# Patient Record
Sex: Male | Born: 1965 | Race: White | Hispanic: No | Marital: Married | Smoking: Current every day smoker
Health system: Southern US, Community
[De-identification: ages and names within clinical notes are randomized; demographics above are authoritative.]

## PROBLEM LIST (undated history)

## (undated) ENCOUNTER — Emergency Department (HOSPITAL_COMMUNITY): Payer: Self-pay

## (undated) DIAGNOSIS — S069XAA Unspecified intracranial injury with loss of consciousness status unknown, initial encounter: Secondary | ICD-10-CM

## (undated) DIAGNOSIS — F419 Anxiety disorder, unspecified: Secondary | ICD-10-CM

## (undated) DIAGNOSIS — I2510 Atherosclerotic heart disease of native coronary artery without angina pectoris: Secondary | ICD-10-CM

## (undated) DIAGNOSIS — T63441A Toxic effect of venom of bees, accidental (unintentional), initial encounter: Secondary | ICD-10-CM

## (undated) DIAGNOSIS — S069X9A Unspecified intracranial injury with loss of consciousness of unspecified duration, initial encounter: Secondary | ICD-10-CM

## (undated) DIAGNOSIS — A770 Spotted fever due to Rickettsia rickettsii: Secondary | ICD-10-CM

## (undated) DIAGNOSIS — I776 Arteritis, unspecified: Secondary | ICD-10-CM

## (undated) HISTORY — PX: OTHER SURGICAL HISTORY: SHX169

## (undated) HISTORY — DX: Atherosclerotic heart disease of native coronary artery without angina pectoris: I25.10

---

## 2008-08-25 ENCOUNTER — Telehealth: Payer: Self-pay | Admitting: Internal Medicine

## 2008-08-25 ENCOUNTER — Ambulatory Visit: Payer: Self-pay | Admitting: Internal Medicine

## 2008-08-25 ENCOUNTER — Ambulatory Visit (HOSPITAL_BASED_OUTPATIENT_CLINIC_OR_DEPARTMENT_OTHER): Admission: RE | Admit: 2008-08-25 | Discharge: 2008-08-25 | Payer: Self-pay | Admitting: Internal Medicine

## 2008-08-25 ENCOUNTER — Ambulatory Visit: Payer: Self-pay | Admitting: Interventional Radiology

## 2008-08-25 DIAGNOSIS — R0989 Other specified symptoms and signs involving the circulatory and respiratory systems: Secondary | ICD-10-CM

## 2008-08-25 DIAGNOSIS — R0609 Other forms of dyspnea: Secondary | ICD-10-CM | POA: Insufficient documentation

## 2008-08-25 DIAGNOSIS — R002 Palpitations: Secondary | ICD-10-CM

## 2008-08-25 DIAGNOSIS — F411 Generalized anxiety disorder: Secondary | ICD-10-CM

## 2008-08-25 DIAGNOSIS — Z87442 Personal history of urinary calculi: Secondary | ICD-10-CM

## 2008-08-25 DIAGNOSIS — F17200 Nicotine dependence, unspecified, uncomplicated: Secondary | ICD-10-CM

## 2008-08-25 LAB — CONVERTED CEMR LAB
Albumin: 4.3 g/dL (ref 3.5–5.2)
Alkaline Phosphatase: 64 units/L (ref 39–117)
BUN: 14 mg/dL (ref 6–23)
Basophils Relative: 0 % (ref 0.0–3.0)
Creatinine, Ser: 0.8 mg/dL (ref 0.4–1.5)
Direct LDL: 146.4 mg/dL
Eosinophils Absolute: 0.1 10*3/uL (ref 0.0–0.7)
Eosinophils Relative: 2 % (ref 0.0–5.0)
GFR calc Af Amer: 135 mL/min
GFR calc non Af Amer: 112 mL/min
Glucose, Bld: 95 mg/dL (ref 70–99)
HCT: 49.4 % (ref 39.0–52.0)
Hemoglobin: 17.2 g/dL — ABNORMAL HIGH (ref 13.0–17.0)
MCV: 90.4 fL (ref 78.0–100.0)
Monocytes Absolute: 0.4 10*3/uL (ref 0.1–1.0)
Monocytes Relative: 8.1 % (ref 3.0–12.0)
Neutro Abs: 3 10*3/uL (ref 1.4–7.7)
Pro B Natriuretic peptide (BNP): 4 pg/mL (ref 0.0–100.0)
RBC: 5.47 M/uL (ref 4.22–5.81)
TSH: 0.86 microintl units/mL (ref 0.35–5.50)
Total CHOL/HDL Ratio: 6.9
Total Protein: 6.7 g/dL (ref 6.0–8.3)
Triglycerides: 126 mg/dL (ref 0–149)
WBC: 4.7 10*3/uL (ref 4.5–10.5)

## 2008-08-29 ENCOUNTER — Encounter: Payer: Self-pay | Admitting: Cardiology

## 2008-08-29 ENCOUNTER — Ambulatory Visit: Payer: Self-pay | Admitting: Cardiology

## 2008-09-06 ENCOUNTER — Encounter: Payer: Self-pay | Admitting: Cardiology

## 2008-09-06 ENCOUNTER — Ambulatory Visit: Payer: Self-pay | Admitting: Cardiology

## 2008-09-06 ENCOUNTER — Ambulatory Visit: Payer: Self-pay

## 2008-09-12 ENCOUNTER — Ambulatory Visit: Payer: Self-pay | Admitting: Internal Medicine

## 2008-09-12 DIAGNOSIS — M25519 Pain in unspecified shoulder: Secondary | ICD-10-CM

## 2008-09-12 DIAGNOSIS — E785 Hyperlipidemia, unspecified: Secondary | ICD-10-CM | POA: Insufficient documentation

## 2008-09-12 DIAGNOSIS — M722 Plantar fascial fibromatosis: Secondary | ICD-10-CM

## 2008-09-14 ENCOUNTER — Encounter: Payer: Self-pay | Admitting: Internal Medicine

## 2008-11-23 ENCOUNTER — Encounter (INDEPENDENT_AMBULATORY_CARE_PROVIDER_SITE_OTHER): Payer: Self-pay | Admitting: *Deleted

## 2013-01-12 ENCOUNTER — Observation Stay (HOSPITAL_COMMUNITY): Payer: Worker's Compensation

## 2013-01-12 ENCOUNTER — Observation Stay (HOSPITAL_COMMUNITY)
Admission: EM | Admit: 2013-01-12 | Discharge: 2013-01-13 | Disposition: A | Discharge status: Home / Self Care | Payer: Worker's Compensation | Attending: Internal Medicine | Admitting: Internal Medicine

## 2013-01-12 ENCOUNTER — Emergency Department (HOSPITAL_COMMUNITY): Payer: Worker's Compensation

## 2013-01-12 ENCOUNTER — Encounter (HOSPITAL_COMMUNITY): Payer: Self-pay | Admitting: *Deleted

## 2013-01-12 DIAGNOSIS — T148XXA Other injury of unspecified body region, initial encounter: Secondary | ICD-10-CM

## 2013-01-12 DIAGNOSIS — W19XXXA Unspecified fall, initial encounter: Secondary | ICD-10-CM | POA: Insufficient documentation

## 2013-01-12 DIAGNOSIS — I959 Hypotension, unspecified: Secondary | ICD-10-CM | POA: Insufficient documentation

## 2013-01-12 DIAGNOSIS — IMO0002 Reserved for concepts with insufficient information to code with codable children: Secondary | ICD-10-CM | POA: Insufficient documentation

## 2013-01-12 DIAGNOSIS — R739 Hyperglycemia, unspecified: Secondary | ICD-10-CM

## 2013-01-12 DIAGNOSIS — R4020 Unspecified coma: Secondary | ICD-10-CM

## 2013-01-12 DIAGNOSIS — R7309 Other abnormal glucose: Secondary | ICD-10-CM | POA: Insufficient documentation

## 2013-01-12 DIAGNOSIS — T782XXA Anaphylactic shock, unspecified, initial encounter: Secondary | ICD-10-CM

## 2013-01-12 DIAGNOSIS — T63481A Toxic effect of venom of other arthropod, accidental (unintentional), initial encounter: Secondary | ICD-10-CM

## 2013-01-12 DIAGNOSIS — T6391XA Toxic effect of contact with unspecified venomous animal, accidental (unintentional), initial encounter: Principal | ICD-10-CM | POA: Insufficient documentation

## 2013-01-12 DIAGNOSIS — S0081XA Abrasion of other part of head, initial encounter: Secondary | ICD-10-CM

## 2013-01-12 DIAGNOSIS — T63461A Toxic effect of venom of wasps, accidental (unintentional), initial encounter: Secondary | ICD-10-CM | POA: Insufficient documentation

## 2013-01-12 DIAGNOSIS — R404 Transient alteration of awareness: Secondary | ICD-10-CM

## 2013-01-12 HISTORY — DX: Toxic effect of venom of bees, accidental (unintentional), initial encounter: T63.441A

## 2013-01-12 LAB — CG4 I-STAT (LACTIC ACID): Lactic Acid, Venous: 7.23 mmol/L — ABNORMAL HIGH (ref 0.5–2.2)

## 2013-01-12 LAB — COMPREHENSIVE METABOLIC PANEL
Alkaline Phosphatase: 63 U/L (ref 39–117)
BUN: 13 mg/dL (ref 6–23)
Creatinine, Ser: 1.24 mg/dL (ref 0.50–1.35)
GFR calc Af Amer: 78 mL/min — ABNORMAL LOW (ref >90)
Glucose, Bld: 172 mg/dL — ABNORMAL HIGH (ref 70–99)
Potassium: 3.3 mEq/L — ABNORMAL LOW (ref 3.5–5.1)
Total Protein: 5.5 g/dL — ABNORMAL LOW (ref 6.0–8.3)

## 2013-01-12 LAB — CBC WITH DIFFERENTIAL/PLATELET
Eosinophils Absolute: 0 10*3/uL (ref 0.0–0.7)
Eosinophils Relative: 1 % (ref 0–5)
HCT: 51.4 % (ref 39.0–52.0)
Hemoglobin: 18.2 g/dL — ABNORMAL HIGH (ref 13.0–17.0)
Lymphs Abs: 4.6 10*3/uL — ABNORMAL HIGH (ref 0.7–4.0)
MCH: 30.8 pg (ref 26.0–34.0)
MCV: 87 fL (ref 78.0–100.0)
Monocytes Absolute: 0 10*3/uL — ABNORMAL LOW (ref 0.1–1.0)
Monocytes Relative: 0 % — ABNORMAL LOW (ref 3–12)
RBC: 5.91 MIL/uL — ABNORMAL HIGH (ref 4.22–5.81)

## 2013-01-12 LAB — POCT I-STAT, CHEM 8
Calcium, Ion: 1.07 mmol/L — ABNORMAL LOW (ref 1.12–1.23)
Chloride: 112 mEq/L (ref 96–112)
Creatinine, Ser: 1.3 mg/dL (ref 0.50–1.35)
Glucose, Bld: 199 mg/dL — ABNORMAL HIGH (ref 70–99)
Potassium: 3 mEq/L — ABNORMAL LOW (ref 3.5–5.1)

## 2013-01-12 IMAGING — CT CT HEAD W/O CM
3 of 5 series · 16 of 47 positions shown, 19 images · non-contrast
Comparison: None.

CT HEAD

CLINICAL DATA: Anaphylactic reaction to a bee sting.  Acute mental
status changes.

CT HEAD WITHOUT CONTRAST
CT MAXILLOFACIAL WITHOUT CONTRAST
TECHNIQUE: Multidetector CT imaging of the head and maxillofacial
structures were performed using the standard protocol without
intravenous contrast. Multiplanar CT image reconstructions of the
maxillofacial structures were also generated.

[Series 4: facial bones · axial · 0.41mm/px · z∈[+28,+182]mm · 10 of 91 slices shown, 13 images]
[im 7/91  brain]
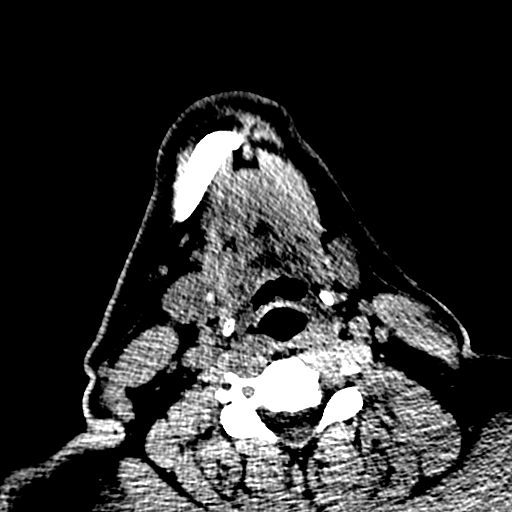
[im 7/91  bone]
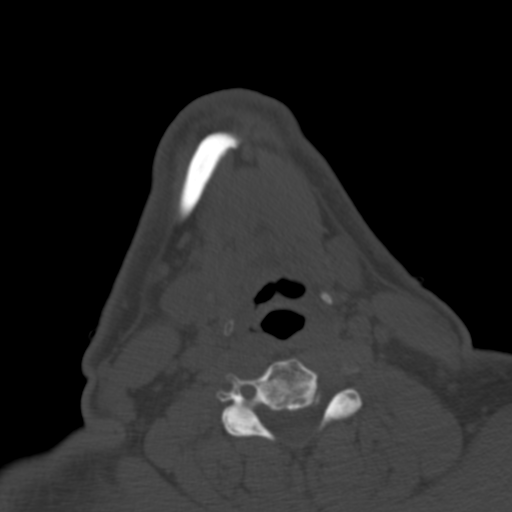
[im 14/91  brain]
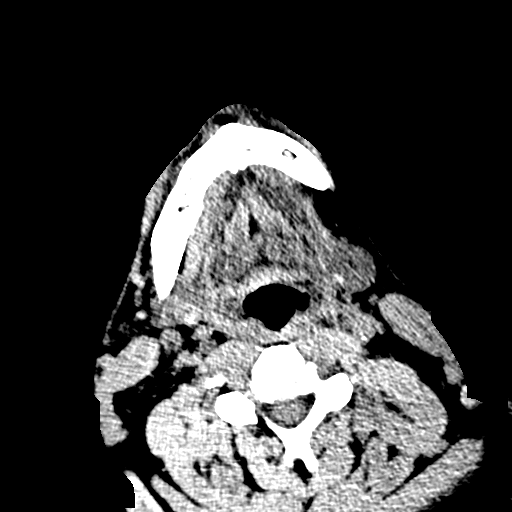
[im 28/91  brain]
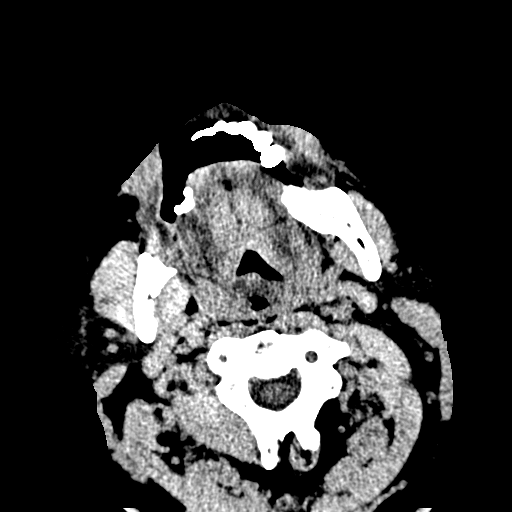
[im 35/91  brain]
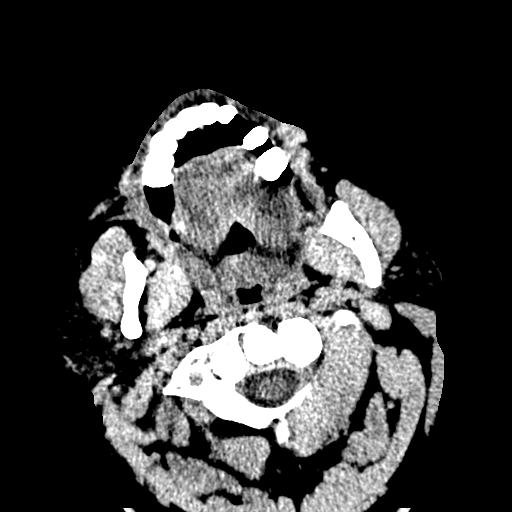
[im 42/91  brain]
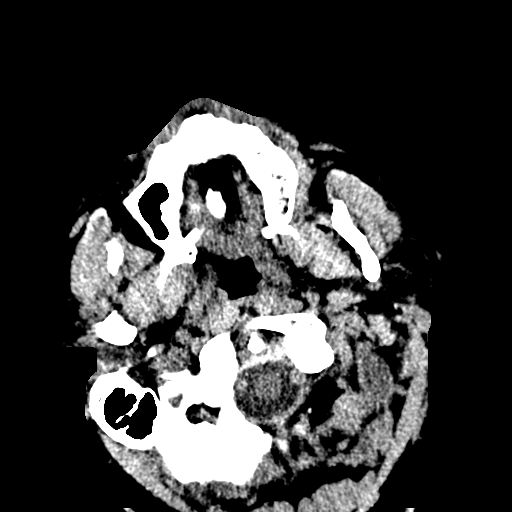
[im 42/91  bone]
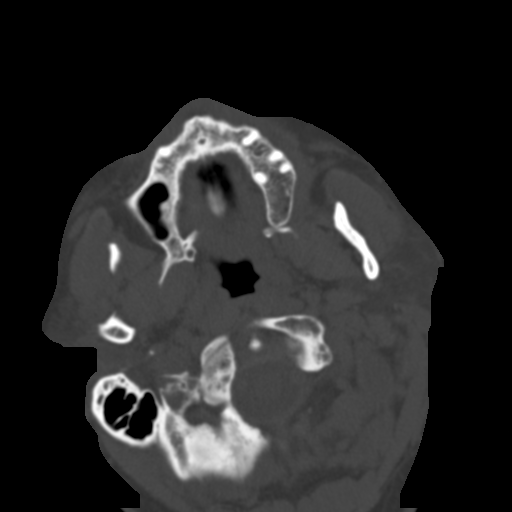
[im 49/91  brain]
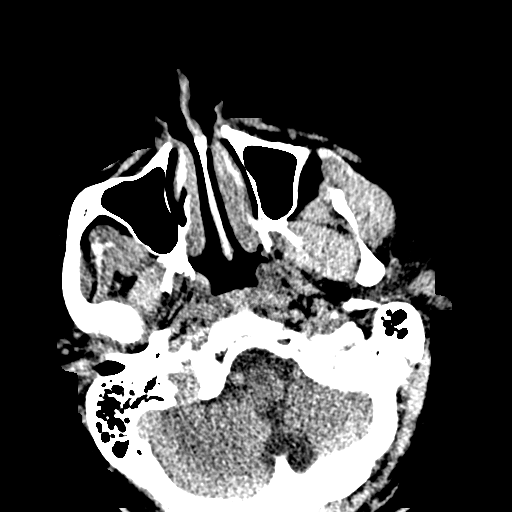
[im 56/91  brain]
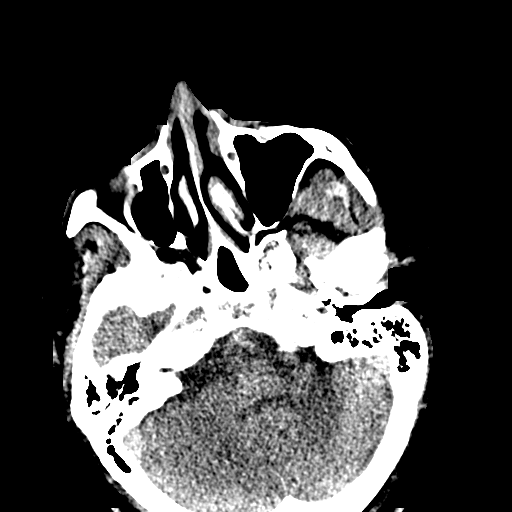
[im 70/91  brain]
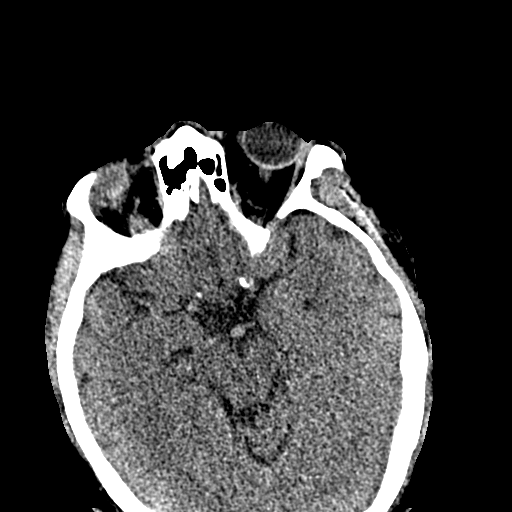
[im 77/91  brain]
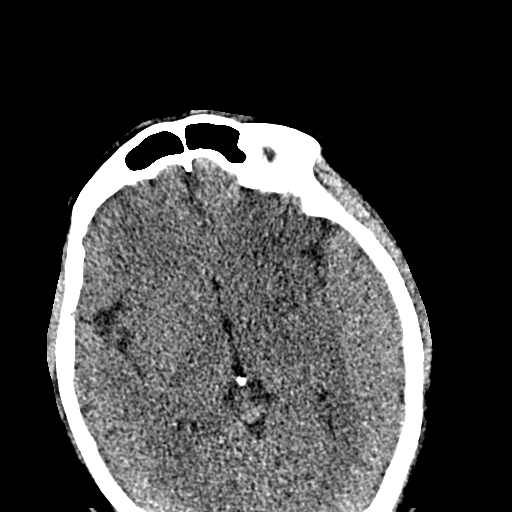
[im 77/91  bone]
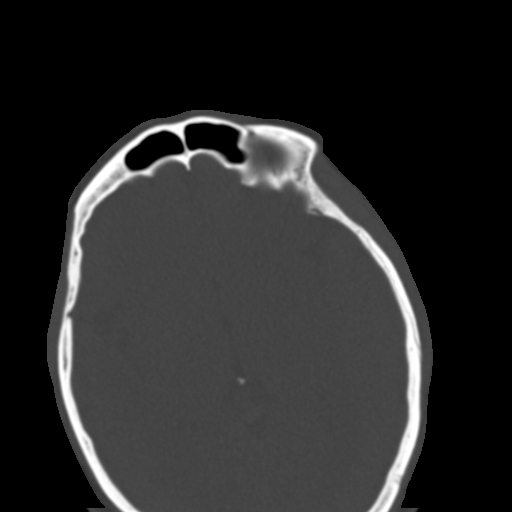
[im 84/91  brain]
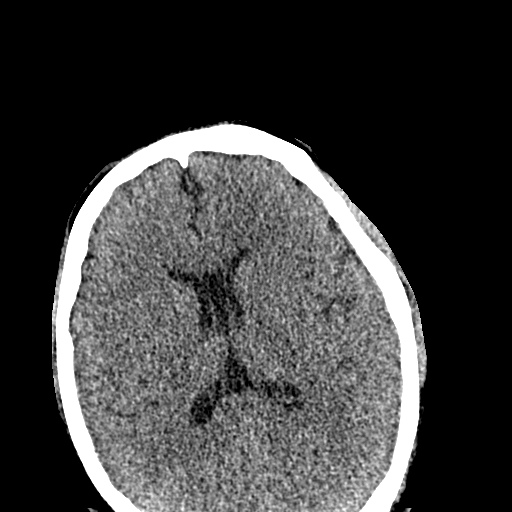

[st cor · coronal · 0.41mm/px · 3 of 88 slices shown]
[im 30/88  brain]
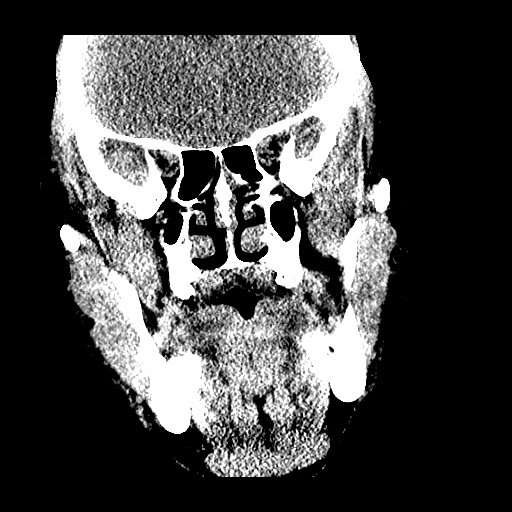
[im 39/88  brain]
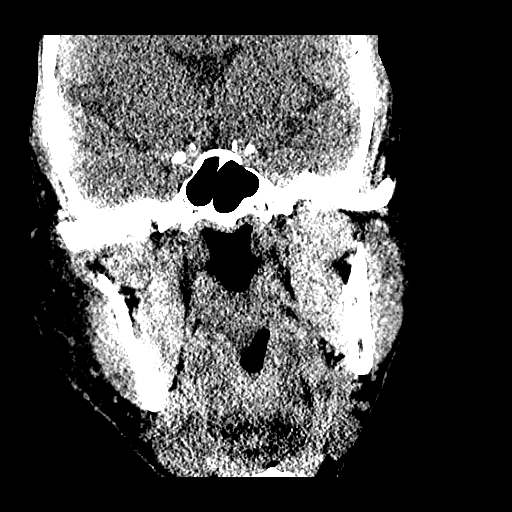
[im 49/88  brain]
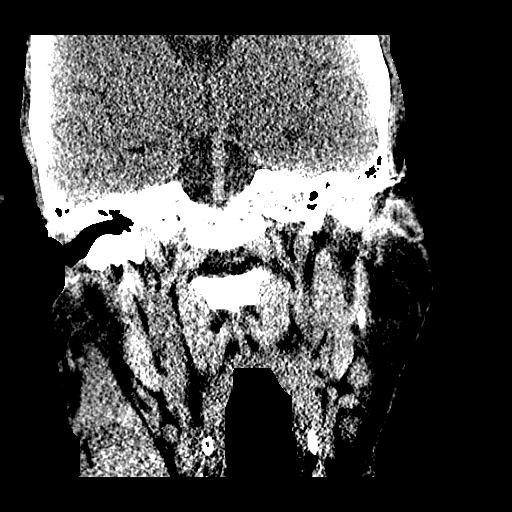

[st sag · sagittal · 0.41mm/px · 3 of 90 slices shown]
[im 30/90  brain]
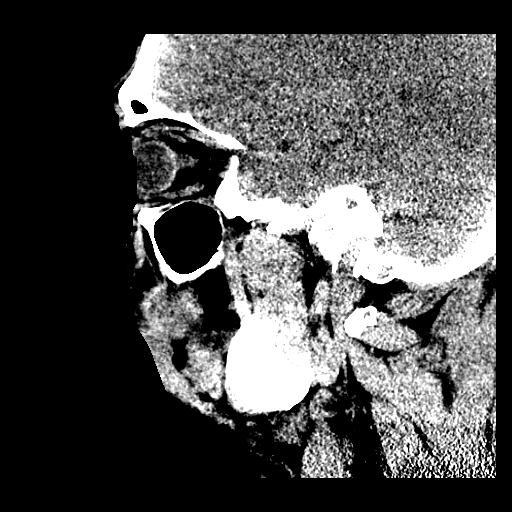
[im 45/90  brain]
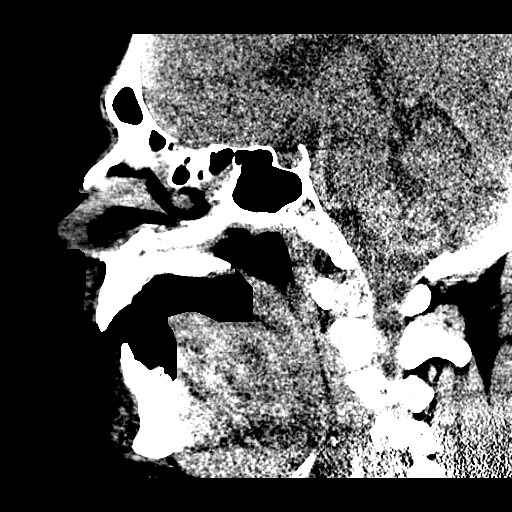
[im 60/90  brain]
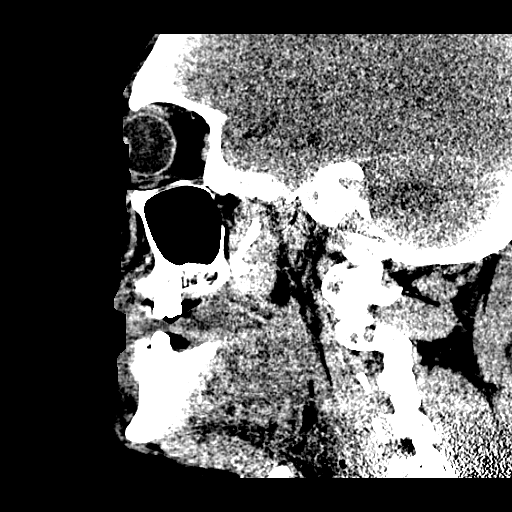

[16 of 47 positions shown; findings below may reference images not displayed]

FINDINGS: Ventricular system normal in size and appearance for age.
Cavum septum placenta and cavum vergae, a normal anatomic variant.
No mass lesion.  No midline shift.  No acute hemorrhage or
hematoma.  No extra-axial fluid collections.  No evidence of acute
infarction.  No focal brain parenchymal abnormalities.

No focal osseous abnormalities involving the skull.  Bilateral
mastoid air cells and middle ear cavities well-aerated.  Mild
bilateral carotid siphon atherosclerosis.
IMPRESSION: 1.  No acute or significant intracranial abnormality.
2.  Mild bilateral carotid siphon atherosclerosis, somewhat
advanced for age.

CT MAXILLOFACIAL
FINDINGS: No fractures identified involving the facial bones.
Temporomandibular joints intact with slight anterior subluxation of
the right TMJ.  Slight bony nasal septal deviation to the right.
Paranasal sinuses well-aerated.  Dystrophic calcification noted in
the lacrimal gland of the left orbit.  Periapical lucency involving
the third right mandibular molar, with only a portion of the tooth
remaining.  Probable tooth buds in both sides of the posterior
maxilla at the expected location of the third molars.
IMPRESSION: 1.  No fractures involving the facial bones.
2.  Slight anterior subluxation of the right temporomandibular
joint.
3.  Periapical lucency involving the third molar of the right
mandible; abscess is suspected, given the destruction of the tooth.

## 2013-01-12 MED ORDER — ONDANSETRON HCL 4 MG/2ML IJ SOLN: 4.0000 mg | Freq: Three times a day (TID) | INTRAMUSCULAR | Status: DC | PRN

## 2013-01-12 MED ORDER — FLUMAZENIL 0.5 MG/5ML IV SOLN
INTRAVENOUS | Status: AC
  Filled 2013-01-12: qty 5

## 2013-01-12 MED ORDER — ACETAMINOPHEN 650 MG RE SUPP: 650.0000 mg | Freq: Four times a day (QID) | RECTAL | Status: DC | PRN

## 2013-01-12 MED ORDER — ONDANSETRON HCL 4 MG PO TABS: 4.0000 mg | ORAL_TABLET | Freq: Four times a day (QID) | ORAL | Status: DC | PRN

## 2013-01-12 MED ORDER — DIPHENHYDRAMINE HCL 50 MG/ML IJ SOLN: 25.0000 mg | Freq: Three times a day (TID) | INTRAMUSCULAR | Status: DC | PRN

## 2013-01-12 MED ORDER — NICOTINE 14 MG/24HR TD PT24
14.0000 mg | MEDICATED_PATCH | Freq: Every day | TRANSDERMAL | Status: DC
  Administered 2013-01-12 – 2013-01-13 (×2): 14 mg via TRANSDERMAL
  Filled 2013-01-12 (×2): qty 1

## 2013-01-12 MED ORDER — ACETAMINOPHEN 325 MG PO TABS: 650.0000 mg | ORAL_TABLET | Freq: Four times a day (QID) | ORAL | Status: DC | PRN

## 2013-01-12 MED ORDER — POTASSIUM CHLORIDE 10 MEQ/100ML IV SOLN
10.0000 meq | Freq: Once | INTRAVENOUS | Status: AC
  Administered 2013-01-12: 10 meq via INTRAVENOUS
  Filled 2013-01-12: qty 100

## 2013-01-12 MED ORDER — SODIUM CHLORIDE 0.9 % IV SOLN
INTRAVENOUS | Status: DC
  Administered 2013-01-12 – 2013-01-13 (×2): via INTRAVENOUS

## 2013-01-12 MED ORDER — SODIUM CHLORIDE 0.9 % IJ SOLN
3.0000 mL | Freq: Two times a day (BID) | INTRAMUSCULAR | Status: DC
  Administered 2013-01-13: 3 mL via INTRAVENOUS

## 2013-01-12 MED ORDER — OXYCODONE HCL 5 MG PO TABS
5.0000 mg | ORAL_TABLET | ORAL | Status: DC | PRN
  Administered 2013-01-12: 5 mg via ORAL
  Filled 2013-01-12: qty 1

## 2013-01-12 MED ORDER — POTASSIUM CHLORIDE CRYS ER 20 MEQ PO TBCR
40.0000 meq | EXTENDED_RELEASE_TABLET | Freq: Once | ORAL | Status: AC
  Administered 2013-01-12: 40 meq via ORAL
  Filled 2013-01-12: qty 2

## 2013-01-12 MED ORDER — ONDANSETRON HCL 4 MG/2ML IJ SOLN: 4.0000 mg | Freq: Four times a day (QID) | INTRAMUSCULAR | Status: DC | PRN

## 2013-01-12 NOTE — ED Notes (Signed)
Results of lactic acid shown to Dr. Malen Gauze

## 2013-01-12 NOTE — ED Notes (Signed)
Pt undressed, in gown, on monitor, continuous pulse oximetry, blood pressure cuff and oxygen [place] (2L); sheet given

## 2013-01-12 NOTE — ED Provider Notes (Signed)
CSN: [id number]     Arrival date & time 01/12/13  1242 History     First MD Initiated Contact with Patient 01/12/13 1245     Chief Complaint  Patient presents with  . Allergic Reaction   (Consider location/radiation/quality/duration/timing/severity/associated sxs/prior Treatment) HPI Darin White 47 y.o. presents by EMS for concern of an allergic reaction reportedly do to a bee sting. He was found in a parking lot on the ground. This occurred approximately 30 minutes prior to arrival. He reportedly gave himself one dose of epinephrine 3 EpiPen that he had on self. Patient was found confused, in respiratory distress, and combative. EMS gave the patient another dose of intramuscular epinephrine, 125 mg Solu-Medrol, Benadryl, and Zantac. Patient continued to be combative en route with EMS and was given 5 mg of Versed for his agitation. On arrival patient is breathing spontaneously and in no respiratory distress but not following commands or answering questions.  History reviewed. No pertinent past medical history. History reviewed. No pertinent past surgical history. History reviewed. No pertinent family history. History  Substance Use Topics  . Smoking status: Not on file  . Smokeless tobacco: Not on file  . Alcohol Use: Not on file    Review of Systems  Unable to perform ROS   Allergies  Review of patient's allergies indicates not on file.  Home Medications  No current outpatient prescriptions on file. BP 90/64  Pulse 81  Resp 25  SpO2 99% Physical Exam  Nursing note and vitals reviewed. Constitutional: He appears well-developed and well-nourished. No distress.  HENT:  Head: Normocephalic. Head is with abrasion (Right forehead and right cheek).  Eyes: Conjunctivae are normal. Pupils are equal, round, and reactive to light. Right eye exhibits no discharge. Left eye exhibits no discharge. Right pupil is reactive. Left pupil is reactive.  Neck: Normal range of motion. Neck  supple. No tracheal deviation present.  Cardiovascular: Regular rhythm and normal heart sounds.  Tachycardia present.  Exam reveals no friction rub.   No murmur heard. Pulmonary/Chest: Effort normal and breath sounds normal. No stridor. No respiratory distress. He has no wheezes. He has no rales. He exhibits no tenderness.  Abdominal: Soft. He exhibits no distension. There is no tenderness. There is no rebound and no guarding.  Skin: Skin is warm. He is diaphoretic.  Psychiatric: He has a normal mood and affect.    ED Course   Procedures (including critical care time)  Labs Reviewed  CBC WITH DIFFERENTIAL - Abnormal; Notable for the following:    RBC 5.91 (*)    Hemoglobin 18.2 (*)    Neutrophils Relative % 31 (*)    Lymphocytes Relative 69 (*)    Lymphs Abs 4.6 (*)    Monocytes Relative 0 (*)    Monocytes Absolute 0.0 (*)    All other components within normal limits  POCT I-STAT, CHEM 8 - Abnormal; Notable for the following:    Potassium 3.0 (*)    Glucose, Bld 199 (*)    Calcium, Ion 1.07 (*)    Hemoglobin 18.4 (*)    HCT 54.0 (*)    All other components within normal limits  CG4 I-STAT (LACTIC ACID) - Abnormal; Notable for the following:    Lactic Acid, Venous 12.99 (*)    All other components within normal limits  CG4 I-STAT (LACTIC ACID) - Abnormal; Notable for the following:    Lactic Acid, Venous 7.23 (*)    All other components within normal limits  COMPREHENSIVE METABOLIC PANEL  Results for orders placed during the hospital encounter of 01/12/13  CBC WITH DIFFERENTIAL      Result Value Range   WBC 6.8  4.0 - 10.5 K/uL   RBC 5.91 (*) 4.22 - 5.81 MIL/uL   Hemoglobin 18.2 (*) 13.0 - 17.0 g/dL   HCT 65.7  84.6 - 96.2 %   MCV 87.0  78.0 - 100.0 fL   MCH 30.8  26.0 - 34.0 pg   MCHC 35.4  30.0 - 36.0 g/dL   RDW 95.2  84.1 - 32.4 %   Platelets 244  150 - 400 K/uL   Neutrophils Relative % 31 (*) 43 - 77 %   Neutro Abs 2.1  1.7 - 7.7 K/uL   Lymphocytes Relative 69  (*) 12 - 46 %   Lymphs Abs 4.6 (*) 0.7 - 4.0 K/uL   Monocytes Relative 0 (*) 3 - 12 %   Monocytes Absolute 0.0 (*) 0.1 - 1.0 K/uL   Eosinophils Relative 1  0 - 5 %   Eosinophils Absolute 0.0  0.0 - 0.7 K/uL   Basophils Relative 0  0 - 1 %   Basophils Absolute 0.0  0.0 - 0.1 K/uL  POCT I-STAT, CHEM 8      Result Value Range   Sodium 145  135 - 145 mEq/L   Potassium 3.0 (*) 3.5 - 5.1 mEq/L   Chloride 112  96 - 112 mEq/L   BUN 12  6 - 23 mg/dL   Creatinine, Ser 4.01  0.50 - 1.35 mg/dL   Glucose, Bld 027 (*) 70 - 99 mg/dL   Calcium, Ion 2.53 (*) 1.12 - 1.23 mmol/L   TCO2 12  0 - 100 mmol/L   Hemoglobin 18.4 (*) 13.0 - 17.0 g/dL   HCT 66.4 (*) 40.3 - 47.4 %  CG4 I-STAT (LACTIC ACID)      Result Value Range   Lactic Acid, Venous 12.99 (*) 0.5 - 2.2 mmol/L  CG4 I-STAT (LACTIC ACID)      Result Value Range   Lactic Acid, Venous 7.23 (*) 0.5 - 2.2 mmol/L    Dg Chest Portable 1 View  01/12/2013   *RADIOLOGY REPORT*  Clinical Data: Allergic reaction.  PORTABLE CHEST - 1 VIEW  Comparison: None.  Findings: 1323 hours.  There are low lung volumes. Allowing for this, the lungs appear clear and heart size and mediastinal contours are normal.  There is no pleural effusion or pneumothorax. Telemetry leads overlie the lower chest.  IMPRESSION: No acute cardiopulmonary process.  Suboptimal inspiration.   Original Report Authenticated By: Carey Bullocks, M.D.   1. Anaphylaxis, initial encounter     Date: 01/12/2013  Rate: 90  Rhythm: normal sinus rhythm  QRS Axis: normal  Intervals: QT prolonged and borderline prolonged QTc  ST/T Wave abnormalities: normal  Conduction Disutrbances:none  Narrative Interpretation: NSR  Old EKG Reviewed: none available   MDM  Darin White 47 y.o. presents with concern for allergic reaction. Patient was given Versed for combativeness with EMS. He has received the appropriate medications for anaphylaxis and allergic reaction including including steroids, Benadryl,  ranitidine, and epinephrine. Blood pressure soft on arrival 100/54. Slightly tachycardic in the low 100s. Patient is not following any commands but is protecting his airway and breathing spontaneously. There is no respiratory distress. He is moving appropriate air bilaterally. No evidence of angioedema or swelling in the oropharynx. There is superficial abrasions noted on the right side of his face. Patient is also diaphoretic.  Lactic acid  13. There is concern for anaphylactic shock. IV fluids running wide open. Patient was given flumazenil to reverse her said to reassess mental status. Will reassess mental status after has not given. CT head pending as patient has been combative after a fall and has evident trauma to the right side of his face.  After receiving 2 L IV fluids, i-STAT lactic acid now 7. On reevaluation, patient continues to be sleepy but is now answering questions indicated that he was indeed stung by a bee and self administered intramuscular epinephrine. Admission for observation indicated. Patient admitted to the Hospitalist service.   Labs, imaging, reviewed. I discussed this patient's care with my attending, Dr. Radford Pax.  Sena Hitch, MD 01/12/13 [id number]

## 2013-01-12 NOTE — H&P (Signed)
[facility] History and Physical  Darin White ZOX:[id number] DOB: 09/28/1965 DOA: 01/12/2013  Referring physician: Sena Hitch, MD Resident PCP: No PCP Per Patient   Chief Complaint: Anaphylactic shock  HPI: Darin White is a 47 y.o. male with past medical history of anaphylaxis to bee stings, patient brought to the hospital by EMS after he lost consciousness. History obtained from the patient he has wide awake and alert. 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 He had to his truck where he keeps an EpiPen. He made it to the truck, but he did not know if he injected himself or not because he collapsed and his head hit the floor. Upon EMS arrival and patient was given Solu-Medrol, Benadryl, Zantac and epinephrine. He was somewhat combative to the [facility] given 5 mg of Versed for agitation. When I evaluated him he is probably back to his baseline he is slight right ear swelling.  Review of Systems:  Constitutional: negative for anorexia, fevers and sweats Eyes: negative for irritation, redness and visual disturbance Ears, nose, mouth, throat, and face: negative for earaches, epistaxis, nasal congestion and sore throat Respiratory: negative for cough, dyspnea on exertion, sputum and wheezing Cardiovascular: negative for chest pain, dyspnea, lower extremity edema, orthopnea, palpitations and syncope Gastrointestinal: negative for abdominal pain, constipation, diarrhea, melena, nausea and vomiting Genitourinary:negative for dysuria, frequency and hematuria Hematologic/lymphatic: negative for bleeding, easy bruising and lymphadenopathy Musculoskeletal:negative for arthralgias, muscle weakness and stiff joints Neurological: negative for coordination problems, gait problems, headaches and weakness Endocrine: negative for diabetic symptoms including  polydipsia, polyuria and weight loss Allergic/Immunologic: negative for anaphylaxis, hay fever and urticaria  Past Medical History  Diagnosis Date  . Bee sting-induced anaphylaxis    History reviewed. No pertinent past surgical history. Social History:  has no tobacco, alcohol, and drug history on file.  Allergies  Allergen Reactions  . Bee Venom Swelling  . Codeine Nausea And Vomiting    Family History  Problem Relation Age of Onset  . Cancer Father   . Cancer Mother   . Hypertension Mother           Prior to Admission medications   Not on File   Physical Exam: Filed Vitals:   01/12/13 1300 01/12/13 1315 01/12/13 1330 01/12/13 1532  BP: 97/49 92/73 90/64  114/68  Pulse: 82 78 81 71  TempSrc:      Resp: 23 28 25 18   SpO2: 97% 100% 99% 99%   General appearance: alert, cooperative and no distress  Head: Normocephalic, without obvious abnormality, atraumatic  Eyes: conjunctivae/corneas clear. PERRL, EOM's intact. Fundi benign.  Nose: Nares normal. Septum midline. Mucosa normal. No drainage or sinus tenderness.  Throat: lips, mucosa, and tongue normal; teeth and gums normal  Neck: Supple, no masses, no cervical lymphadenopathy, no JVD appreciated, no meningeal signs Resp: clear to auscultation bilaterally  Chest wall: no tenderness  Cardio: regular rate and rhythm, S1, S2 normal, no murmur, click, rub or gallop  GI: soft, non-tender; bowel sounds normal; no masses, no organomegaly  Extremities: extremities normal, atraumatic, no cyanosis or edema  Skin: Skin color, texture, turgor normal. No rashes or lesions  Neurologic: Alert and oriented X 3, normal strength and tone. Normal symmetric reflexes. Normal coordination and gait   Labs on Admission:  Basic Metabolic Panel:  Recent Labs Lab 01/12/13 1255 01/12/13 1400  NA  145 143  K 3.0* 3.3*  CL 112 110  CO2  --  18*  GLUCOSE 199* 172*  BUN 12 13  CREATININE 1.30 1.24  CALCIUM  --  8.2*   Liver Function  Tests:  Recent Labs Lab 01/12/13 1400  AST 19  ALT 19  ALKPHOS 63  BILITOT 0.3  PROT 5.5*  ALBUMIN 3.5   No results found for this basename: LIPASE, AMYLASE,  in the last 168 hours No results found for this basename: AMMONIA,  in the last 168 hours CBC:  Recent Labs Lab 01/12/13 1255 01/12/13 1313  WBC  --  6.8  NEUTROABS  --  2.1  HGB 18.4* 18.2*  HCT 54.0* 51.4  MCV  --  87.0  PLT  --  244   Cardiac Enzymes: No results found for this basename: CKTOTAL, CKMB, CKMBINDEX, TROPONINI,  in the last 168 hours  BNP (last 3 results) No results found for this basename: PROBNP,  in the last 8760 hours CBG: No results found for this basename: GLUCAP,  in the last 168 hours  Radiological Exams on Admission: Ct Head Wo Contrast  01/12/2013   *RADIOLOGY REPORT*  Clinical Data:  Anaphylactic reaction to a bee sting.  Acute mental status changes.  CT HEAD WITHOUT CONTRAST CT MAXILLOFACIAL WITHOUT CONTRAST  Technique:  Multidetector CT imaging of the head and maxillofacial structures were performed using the standard protocol without intravenous contrast. Multiplanar CT image reconstructions of the maxillofacial structures were also generated.  Comparison:   None.  CT HEAD  Findings: Ventricular system normal in size and appearance for age. Cavum septum placenta and cavum vergae, a normal anatomic variant. No mass lesion.  No midline shift.  No acute hemorrhage or hematoma.  No extra-axial fluid collections.  No evidence of acute infarction.  No focal brain parenchymal abnormalities.  No focal osseous abnormalities involving the skull.  Bilateral mastoid air cells and middle ear cavities well-aerated.  Mild bilateral carotid siphon atherosclerosis.  IMPRESSION:  1.  No acute or significant intracranial abnormality. 2.  Mild bilateral carotid siphon atherosclerosis, somewhat advanced for age.  CT MAXILLOFACIAL  Findings:   No fractures identified involving the facial bones. Temporomandibular  joints intact with slight anterior subluxation of the right TMJ.  Slight bony nasal septal deviation to the right. Paranasal sinuses well-aerated.  Dystrophic calcification noted in the lacrimal gland of the left orbit.  Periapical lucency involving the third right mandibular molar, with only a portion of the tooth remaining.  Probable tooth buds in both sides of the posterior maxilla at the expected location of the third molars.  IMPRESSION:  1.  No fractures involving the facial bones. 2.  Slight anterior subluxation of the right temporomandibular joint. 3.  Periapical lucency involving the third molar of the right mandible; abscess is suspected, given the destruction of the tooth.   Original Report Authenticated By: Hulan Saas, M.D.   Dg Chest Portable 1 View  01/12/2013   *RADIOLOGY REPORT*  Clinical Data: Allergic reaction.  PORTABLE CHEST - 1 VIEW  Comparison: None.  Findings: 1323 hours.  There are low lung volumes. Allowing for this, the lungs appear clear and heart size and mediastinal contours are normal.  There is no pleural effusion or pneumothorax. Telemetry leads overlie the lower chest.  IMPRESSION: No acute cardiopulmonary process.  Suboptimal inspiration.   Original Report Authenticated By: Carey Bullocks, M.D.   Ct Maxillofacial Wo Cm  01/12/2013   *RADIOLOGY REPORT*  Clinical Data:  Anaphylactic  reaction to a bee sting.  Acute mental status changes.  CT HEAD WITHOUT CONTRAST CT MAXILLOFACIAL WITHOUT CONTRAST  Technique:  Multidetector CT imaging of the head and maxillofacial structures were performed using the standard protocol without intravenous contrast. Multiplanar CT image reconstructions of the maxillofacial structures were also generated.  Comparison:   None.  CT HEAD  Findings: Ventricular system normal in size and appearance for age. Cavum septum placenta and cavum vergae, a normal anatomic variant. No mass lesion.  No midline shift.  No acute hemorrhage or hematoma.  No  extra-axial fluid collections.  No evidence of acute infarction.  No focal brain parenchymal abnormalities.  No focal osseous abnormalities involving the skull.  Bilateral mastoid air cells and middle ear cavities well-aerated.  Mild bilateral carotid siphon atherosclerosis.  IMPRESSION:  1.  No acute or significant intracranial abnormality. 2.  Mild bilateral carotid siphon atherosclerosis, somewhat advanced for age.  CT MAXILLOFACIAL  Findings:   No fractures identified involving the facial bones. Temporomandibular joints intact with slight anterior subluxation of the right TMJ.  Slight bony nasal septal deviation to the right. Paranasal sinuses well-aerated.  Dystrophic calcification noted in the lacrimal gland of the left orbit.  Periapical lucency involving the third right mandibular molar, with only a portion of the tooth remaining.  Probable tooth buds in both sides of the posterior maxilla at the expected location of the third molars.  IMPRESSION:  1.  No fractures involving the facial bones. 2.  Slight anterior subluxation of the right temporomandibular joint. 3.  Periapical lucency involving the third molar of the right mandible; abscess is suspected, given the destruction of the tooth.   Original Report Authenticated By: Hulan Saas, M.D.    EKG: Independently reviewed.   Assessment/Plan Principal Problem:   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 Active Problems:   LOC (loss of consciousness)   Hyperglycemia   Fall   Abrasion   Anaphylactic shock due to insect bite -Patient presented with hypotension, but resolved after IV fluids. -Received Solu-Medrol, epinephrine, Benadryl and Zantac. -Patient almost back to his baseline, there is slight right ear swelling. -No further management, Benadryl as needed. -Patient will need EpiPen prescription on discharge  Loss of consciousness   -Loss of consciousness with a fall, resulted in face abrasion. -CT scan of the head was done and  showed no evidence of acute abnormalities. -There is a slight right-sided TMJ subluxation, doubt if it's significant, patient open his mouth without difficulty.  Face abrasion -Secondary to fall, patient was mentioning his right wrist hurts. -Will check right hand x-ray.  Hyperglycemia -Likely secondary to the steroids patient received from EMS on the field. -Check hemoglobin A1c is his 2 of siblings has diabetes.  Code Status: Full code Family Communication: Plan discussed with the patient Disposition Plan: Observation overnight, anticipate discharge in a.m.  Time spent: 70 minutes  [facility] Pager [phone]  If 7PM-7AM, please contact night-coverage [email or web address] Password [facility] 01/12/2013, 5:16 PM

## 2013-01-12 NOTE — ED Notes (Signed)
Patient transported to CT 

## 2013-01-12 NOTE — ED Notes (Signed)
Pt arrived by [facility]. Had witnessed bee sting, hx of allergic reaction, pt had epi pen with him. On ems arrival, pt was uncooperative, pt given 2 epi inj, zantac, solumedrol, benadryl and versed 5mg  IM pta.

## 2013-01-13 ENCOUNTER — Observation Stay (HOSPITAL_COMMUNITY): Payer: Worker's Compensation

## 2013-01-13 LAB — BASIC METABOLIC PANEL
CO2: 21 mEq/L (ref 19–32)
Calcium: 8.2 mg/dL — ABNORMAL LOW (ref 8.4–10.5)
Chloride: 109 mEq/L (ref 96–112)
Glucose, Bld: 117 mg/dL — ABNORMAL HIGH (ref 70–99)
Potassium: 4.2 mEq/L (ref 3.5–5.1)
Sodium: 138 mEq/L (ref 135–145)

## 2013-01-13 LAB — CBC
HCT: 38.5 % — ABNORMAL LOW (ref 39.0–52.0)
Hemoglobin: 14.2 g/dL (ref 13.0–17.0)
MCV: 83.9 fL (ref 78.0–100.0)
RDW: 12.9 % (ref 11.5–15.5)
WBC: 15.2 10*3/uL — ABNORMAL HIGH (ref 4.0–10.5)

## 2013-01-13 LAB — HEMOGLOBIN A1C: Mean Plasma Glucose: 103 mg/dL (ref <117)

## 2013-01-13 MED ORDER — EPINEPHRINE 0.3 MG/0.3ML IJ SOAJ: 0.3000 mg | Freq: Once | INTRAMUSCULAR | Status: AC

## 2013-01-13 MED ORDER — FAMOTIDINE 20 MG PO TABS: 20.0000 mg | ORAL_TABLET | Freq: Two times a day (BID) | ORAL | Status: DC

## 2013-01-13 MED ORDER — DIPHENHYDRAMINE HCL 25 MG PO CAPS: 25.0000 mg | ORAL_CAPSULE | Freq: Four times a day (QID) | ORAL | Status: AC | PRN

## 2013-01-13 NOTE — Plan of Care (Signed)
Problem: Phase I Progression Outcomes Goal: Initial discharge plan identified Outcome: Completed/Met Date Met:  01/13/13 To return home

## 2013-01-13 NOTE — Discharge Summary (Signed)
PATIENT DETAILS Name: Darin White Age: 47 y.o. Sex: male Date of Birth: 07/24/1965 MRN: [id number]. Admit Date: 01/12/2013 Admitting Physician: Clydia Llano, MD PCP:No PCP Per Patient  Recommendations for Outpatient Follow-up:  1. Gen. health maintenance 2. Please monitor abrasion area in the face and bilateral hands 3. 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 Bee sting-induced anaphylaxis     DISCHARGE MEDICATIONS:   Medication List         diphenhydrAMINE 25 mg capsule  Commonly known as:  BENADRYL  Take 1 capsule (25 mg total) by mouth every 6 (six) hours as needed for itching.     EPINEPHrine 0.3 mg/0.3 mL Soaj  Commonly known as:  EPIPEN  Inject 0.3 mLs (0.3 mg total) into the muscle once.     famotidine 20 MG tablet  Commonly known as:  PEPCID  Take 1 tablet (20 mg total) by mouth 2 (two) times daily.        ALLERGIES:   Allergies  Allergen Reactions  . Bee Venom Swelling  . Codeine Nausea And Vomiting    BRIEF HPI:  See H&P, Labs, Consult and Test reports for all details in brief, patient was admitted for a anaphylactic reaction following a bee sting.  CONSULTATIONS:   None  PERTINENT RADIOLOGIC STUDIES: Ct Head Wo Contrast  01/12/2013   *RADIOLOGY REPORT*  Clinical Data:  Anaphylactic reaction to a bee sting.  Acute mental status changes.  CT HEAD WITHOUT CONTRAST CT MAXILLOFACIAL WITHOUT CONTRAST  Technique:  Multidetector CT imaging of the head and maxillofacial structures were performed using the standard protocol without intravenous contrast. Multiplanar CT image reconstructions of the maxillofacial structures were also generated.  Comparison:   None.  CT HEAD  Findings: Ventricular system normal in size and  appearance for age. Cavum septum placenta and cavum vergae, a normal anatomic variant. No mass lesion.  No midline shift.  No acute hemorrhage or hematoma.  No extra-axial fluid collections.  No evidence of acute infarction.  No focal brain parenchymal abnormalities.  No focal osseous abnormalities involving the skull.  Bilateral mastoid air cells and middle ear cavities well-aerated.  Mild bilateral carotid siphon atherosclerosis.  IMPRESSION:  1.  No acute or significant intracranial abnormality. 2.  Mild bilateral carotid siphon atherosclerosis, somewhat advanced for age.  CT MAXILLOFACIAL  Findings:   No fractures identified involving the facial bones. Temporomandibular joints intact with slight anterior subluxation of the right TMJ.  Slight bony nasal septal deviation to the right. Paranasal sinuses well-aerated.  Dystrophic calcification noted in the lacrimal gland of the left orbit.  Periapical lucency involving the third right mandibular molar, with only a portion of the tooth remaining.  Probable tooth buds in both sides of the posterior maxilla at the expected location of the third molars.  IMPRESSION:  1.  No fractures involving the facial bones. 2.  Slight anterior subluxation of the right temporomandibular joint. 3.  Periapical lucency involving the third molar of the right mandible; abscess is suspected, given the destruction of the tooth.   Original Report Authenticated By: Hulan Saas, M.D.   Dg Hand 2 View Left  01/13/2013   *RADIOLOGY REPORT*  Clinical Data: Pain, swelling.  LEFT HAND - 2 VIEW  Comparison: None.  Findings: No acute bony abnormality.  Specifically, no fracture, subluxation, or dislocation.  Soft tissues are intact. Joint spaces are maintained.  Normal bone mineralization.  IMPRESSION: Negative.   Original Report Authenticated By: Charlett Nose, M.D.   Dg Chest Portable 1 View  01/12/2013   *RADIOLOGY REPORT*  Clinical Data: Allergic reaction.  PORTABLE CHEST - 1 VIEW   Comparison: None.  Findings: 1323 hours.  There are low lung volumes. Allowing for this, the lungs appear clear and heart size and mediastinal contours are normal.  There is no pleural effusion or pneumothorax. Telemetry leads overlie the lower chest.  IMPRESSION: No acute cardiopulmonary process.  Suboptimal inspiration.   Original Report Authenticated By: Carey Bullocks, M.D.   Dg Hand Complete Right  01/12/2013   *RADIOLOGY REPORT*  Clinical Data: Post traumatic pain  RIGHT HAND - COMPLETE 3+ VIEW  Comparison: None.  Findings: No acute fracture or dislocation is identified.  No gross soft tissue abnormality is seen.  IMPRESSION: No acute abnormality noted.   Original Report Authenticated By: Alcide Clever, M.D.   Ct Maxillofacial Wo Cm  01/12/2013   *RADIOLOGY REPORT*  Clinical Data:  Anaphylactic reaction to a bee sting.  Acute mental status changes.  CT HEAD WITHOUT CONTRAST CT MAXILLOFACIAL WITHOUT CONTRAST  Technique:  Multidetector CT imaging of the head and maxillofacial structures were performed using the standard protocol without intravenous contrast. Multiplanar CT image reconstructions of the maxillofacial structures were also generated.  Comparison:   None.  CT HEAD  Findings: Ventricular system normal in size and appearance for age. Cavum septum placenta and cavum vergae, a normal anatomic variant. No mass lesion.  No midline shift.  No acute hemorrhage or hematoma.  No extra-axial fluid collections.  No evidence of acute infarction.  No focal brain parenchymal abnormalities.  No focal osseous abnormalities involving the skull.  Bilateral mastoid air cells and middle ear cavities well-aerated.  Mild bilateral carotid siphon atherosclerosis.  IMPRESSION:  1.  No acute or significant intracranial abnormality. 2.  Mild bilateral carotid siphon atherosclerosis, somewhat advanced for age.  CT MAXILLOFACIAL  Findings:   No fractures identified involving the facial bones. Temporomandibular joints intact  with slight anterior subluxation of the right TMJ.  Slight bony nasal septal deviation to the right. Paranasal sinuses well-aerated.  Dystrophic calcification noted in the lacrimal gland of the left orbit.  Periapical lucency involving the third right mandibular molar, with only a portion of the tooth remaining.  Probable tooth buds in both sides of the posterior maxilla at the expected location of the third molars.  IMPRESSION:  1.  No fractures involving the facial bones. 2.  Slight anterior subluxation of the right temporomandibular joint. 3.  Periapical lucency involving the third molar of the right mandible; abscess is suspected, given the destruction of the tooth.   Original Report Authenticated By: Hulan Saas, M.D.     PERTINENT LAB RESULTS: CBC:  Recent Labs  01/12/13 1313 01/13/13 0530  WBC 6.8 15.2*  HGB 18.2* 14.2  HCT 51.4 38.5*  PLT 244 169   CMET CMP     Component Value Date/Time   NA 138 01/13/2013 0937   K 4.2 01/13/2013 0937   CL 109 01/13/2013 0937   CO2 21 01/13/2013 0937   GLUCOSE 117* 01/13/2013 0937   BUN 12 01/13/2013 0937   CREATININE 0.91 01/13/2013 0937   CALCIUM 8.2* 01/13/2013 0937   PROT 5.5* 01/12/2013 1400   ALBUMIN 3.5 01/12/2013 1400  AST 19 01/12/2013 1400   ALT 19 01/12/2013 1400   ALKPHOS 63 01/12/2013 1400   BILITOT 0.3 01/12/2013 1400   GFRNONAA >90 01/13/2013 0937   GFRAA >90 01/13/2013 0937    GFR Estimated Creatinine Clearance: 119.1 ml/min (by C-G formula based on Cr of 0.91). No results found for this basename: LIPASE, AMYLASE,  in the last 72 hours No results found for this basename: CKTOTAL, CKMB, CKMBINDEX, TROPONINI,  in the last 72 hours No components found with this basename: POCBNP,  No results found for this basename: DDIMER,  in the last 72 hours  Recent Labs  01/12/13 1941  HGBA1C 5.2   No results found for this basename: CHOL, HDL, LDLCALC, TRIG, CHOLHDL, LDLDIRECT,  in the last 72 hours No results found for this basename:  TSH, T4TOTAL, FREET3, T3FREE, THYROIDAB,  in the last 72 hours No results found for this basename: VITAMINB12, FOLATE, FERRITIN, TIBC, IRON, RETICCTPCT,  in the last 72 hours Coags: No results found for this basename: PT, INR,  in the last 72 hours Microbiology: No results found for this or any previous visit (from the past 240 hour(s)).   BRIEF HOSPITAL COURSE:  Anaphylactic shock due to insect bite  -Patient presented with hypotension, but resolved after IV fluids.  -Received Solu-Medrol, epinephrine, Benadryl and Zantac in the emergency room - No further episodes of hypotension during inpatient stay, he is back to baseline with no shortness of breath. He has ambulated in the hallway without any major issues. He will be provided a prescription of EpiPen prescription on discharge . He has been instructed to wear appropriate clothing to cover majority of his body and outdoors and to avoid beehives and known places of Bee. activity. He claims understanding.  Loss of consciousness  -Loss of consciousness with a fall, resulted in face abrasion.  -CT scan of the head was done and showed no evidence of acute abnormalities.  -There is a slight right-sided TMJ subluxation, doubt if it's significant, patient open his mouth without difficulty .  Face abrasion /hand abrasion -Secondary to fall and also he was combative with EMT staff when he was confused. X-rays of both bilateral hands are negative.  - Patient claims that, his bilateral hands have decreased in size and the swelling seems to have slowly decreased over the past 24 hours as well. - I have asked him to use hot and cold compresses, keep an eye on his bilateral hands and return to the emergency room if the swelling were to worsen or  he were to develop worsening erythema. I've asked him to see his primary care physician in the next one week for followup visit. - Please note, patient refused a tetanus shot, he claims that his last vaccination  was less than 10 years ago.  Hyperglycemia  -Likely secondary to the steroids patient received from EMS on the field.  -hemoglobin A1c was 5.2  Possible mandibular right  3rd molar abscess - Without fever or swelling. I've asked the patient to see a dentist of his choice.  TODAY-DAY OF DISCHARGE:  Subjective:   Darin White today has no headache,no chest abdominal pain,no new weakness tingling or numbness, feels much better wants to go home today. He is requesting discharge today.  Objective:   Blood pressure 107/63, pulse 68, temperature 98.2 F (36.8 C), temperature source Oral, resp. rate 18, height 5\' 10"  (1.778 m), weight 102.604 kg (226 lb 3.2 oz), SpO2 97.00%. No intake or output data in the 24  hours ending 01/13/13 1658 Filed Weights   01/12/13 1700  Weight: 102.604 kg (226 lb 3.2 oz)    Exam Awake Alert, Oriented *3, No new F.N deficits, Normal affect [place].AT,PERRAL Supple Neck,No JVD, No cervical lymphadenopathy appriciated.  Symmetrical Chest wall movement, Good air movement bilaterally, CTAB RRR,No Gallops,Rubs or new Murmurs, No Parasternal Heave +ve B.Sounds, Abd Soft, Non tender, No organomegaly appriciated, No rebound -guarding or rigidity. No Cyanosis, Clubbing or edema, No new Rash or bruise  DISCHARGE CONDITION: Stable  DISPOSITION: Home   DISCHARGE INSTRUCTIONS:    Activity:  As tolerated   Diet recommendation: Regular Diet  Discharge Orders   Future Orders Complete By Expires     Call MD for:  hives  As directed     Call MD for:  redness, tenderness, or signs of infection (pain, swelling, redness, odor or green/yellow discharge around incision site)  As directed     Scheduling Instructions:      If b/l dorsum hand swelling worsens further    Call MD for:  As directed     Scheduling Instructions:      Bee sting    Discharge instructions  As directed     Comments:      Avoid activity around bee hives, when outdoors advise to fully cover your  body with clothing as much as possible    Increase activity slowly  As directed       Follow-up Information   Schedule an appointment as soon as possible for a visit in 1 week to follow up.   Contact information:   Primary care practitioner     Total Time spent on discharge equals 45 minutes.  SignedJeoffrey Massed 01/13/2013 4:58 PM

## 2013-01-13 NOTE — Progress Notes (Signed)
Pt is to be discharged to home. Pt was given discharge teaching, prescriptions, and doctor's note for work. Pt understood the instructions. Pt's belongs have been taken home by his wife. Pt is in no apparent distress and ready to go home.

## 2013-01-13 NOTE — Care Management Note (Signed)
    Page 1 of 1   01/13/2013     11:08:08 AM   CARE MANAGEMENT NOTE 01/13/2013  Patient:  STOY, FENN   Account Number:  [id number]  Date Initiated:  01/13/2013  Documentation initiated by:  Letha Cape  Subjective/Objective Assessment:   dx anaphylactic shock due to insect sting  admit as observation- lives with spouse.     Action/Plan:   Anticipated DC Date:  01/13/2013   Anticipated DC Plan:  HOME/SELF CARE      DC Planning Services  CM consult      Choice offered to / List presented to:             Status of service:  Completed, signed off Medicare Important Message given?   (If response is "NO", the following Medicare IM given date fields will be blank) Date Medicare IM given:   Date Additional Medicare IM given:    Discharge Disposition:  HOME/SELF CARE  Per UR Regulation:  Reviewed for med. necessity/level of care/duration of stay  If discussed at Long Length of Stay Meetings, dates discussed:    Comments:  01/13/13  11:03 Letha Cape RN, BSN [phone] patient lives with spouse, pta indep.  Patient has insurance with [employer] and he has transportation, no needs anticipated.

## 2013-01-13 NOTE — ED Provider Notes (Signed)
I saw and evaluated the patient, reviewed the resident's note and I agree with the findings and plan.   .Face to face Exam:  General:  Lethargic HEENT:  Abrasions Resp:  Normal effort Abd:  Nondistended Neuro:No focal weakness Lymph: No adenopathy   CRITICAL CARE Performed by: Nelva Nay L Total critical care time: 30 min Critical care time was exclusive of separately billable procedures and treating other patients. Critical care was necessary to treat or prevent imminent or life-threatening deterioration. Critical care was time spent personally by me on the following activities: development of treatment plan with patient and/or surrogate as well as nursing, discussions with consultants, evaluation of patient's response to treatment, examination of patient, obtaining history from patient or surrogate, ordering and performing treatments and interventions, ordering and review of laboratory studies, ordering and review of radiographic studies, pulse oximetry and re-evaluation of patient's condition.   Nelia Shi, MD 01/13/13 [phone]

## 2013-01-15 LAB — URINE DRUGS OF ABUSE SCREEN W ALC, ROUTINE (REF LAB)
Amphetamine Screen, Ur: NEGATIVE
Barbiturate Quant, Ur: NEGATIVE
Cocaine Metabolites: NEGATIVE
Creatinine,U: 150.9 mg/dL
Methadone: NEGATIVE
Propoxyphene: NEGATIVE

## 2016-11-22 ENCOUNTER — Emergency Department (HOSPITAL_COMMUNITY)
Admission: EM | Admit: 2016-11-22 | Discharge: 2016-11-22 | Disposition: A | Discharge status: Home / Self Care | Payer: Self-pay | Attending: Emergency Medicine | Admitting: Emergency Medicine

## 2016-11-22 ENCOUNTER — Encounter (HOSPITAL_COMMUNITY): Payer: Self-pay | Admitting: *Deleted

## 2016-11-22 ENCOUNTER — Emergency Department (HOSPITAL_COMMUNITY): Payer: Self-pay

## 2016-11-22 DIAGNOSIS — Z79899 Other long term (current) drug therapy: Secondary | ICD-10-CM | POA: Insufficient documentation

## 2016-11-22 DIAGNOSIS — F17210 Nicotine dependence, cigarettes, uncomplicated: Secondary | ICD-10-CM | POA: Insufficient documentation

## 2016-11-22 DIAGNOSIS — K8020 Calculus of gallbladder without cholecystitis without obstruction: Secondary | ICD-10-CM | POA: Insufficient documentation

## 2016-11-22 DIAGNOSIS — K824 Cholesterolosis of gallbladder: Secondary | ICD-10-CM

## 2016-11-22 LAB — COMPREHENSIVE METABOLIC PANEL
ALK PHOS: 76 U/L (ref 38–126)
ALT: 28 U/L (ref 17–63)
ANION GAP: 8 (ref 5–15)
AST: 22 U/L (ref 15–41)
Albumin: 4.6 g/dL (ref 3.5–5.0)
BILIRUBIN TOTAL: 0.7 mg/dL (ref 0.3–1.2)
BUN: 13 mg/dL (ref 6–20)
CALCIUM: 9.1 mg/dL (ref 8.9–10.3)
CO2: 23 mmol/L (ref 22–32)
Chloride: 108 mmol/L (ref 101–111)
Creatinine, Ser: 0.98 mg/dL (ref 0.61–1.24)
Glucose, Bld: 101 mg/dL — ABNORMAL HIGH (ref 65–99)
Potassium: 4 mmol/L (ref 3.5–5.1)
SODIUM: 139 mmol/L (ref 135–145)
TOTAL PROTEIN: 6.9 g/dL (ref 6.5–8.1)

## 2016-11-22 LAB — CBC
HCT: 48.8 % (ref 39.0–52.0)
HEMOGLOBIN: 16.9 g/dL (ref 13.0–17.0)
MCH: 30.7 pg (ref 26.0–34.0)
MCHC: 34.6 g/dL (ref 30.0–36.0)
MCV: 88.7 fL (ref 78.0–100.0)
PLATELETS: 201 10*3/uL (ref 150–400)
RBC: 5.5 MIL/uL (ref 4.22–5.81)
RDW: 12.9 % (ref 11.5–15.5)
WBC: 6.5 10*3/uL (ref 4.0–10.5)

## 2016-11-22 LAB — URINALYSIS, ROUTINE W REFLEX MICROSCOPIC
Bilirubin Urine: NEGATIVE
Glucose, UA: NEGATIVE mg/dL
Hgb urine dipstick: NEGATIVE
Ketones, ur: 5 mg/dL — AB
Leukocytes, UA: NEGATIVE
NITRITE: NEGATIVE
PROTEIN: NEGATIVE mg/dL
SPECIFIC GRAVITY, URINE: 1.031 — AB (ref 1.005–1.030)
pH: 5 (ref 5.0–8.0)

## 2016-11-22 LAB — LIPASE, BLOOD: Lipase: 34 U/L (ref 11–51)

## 2016-11-22 MED ORDER — HYDROCODONE-ACETAMINOPHEN 5-325 MG PO TABS: 1.0000 | ORAL_TABLET | ORAL | 0 refills | Status: DC | PRN

## 2016-11-22 MED ORDER — ONDANSETRON HCL 4 MG PO TABS: 4.0000 mg | ORAL_TABLET | Freq: Three times a day (TID) | ORAL | 0 refills | Status: DC | PRN

## 2016-11-22 NOTE — Discharge Instructions (Signed)
Read the information below.  Use the prescribed medication as directed.  Please discuss all new medications with your pharmacist.  You may return to the Emergency Department at any time for worsening condition or any new symptoms that concern you.   If you develop high fevers, worsening abdominal pain, uncontrolled vomiting, or are unable to tolerate fluids by mouth, return to the ER for a recheck.  ° °

## 2016-11-22 NOTE — ED Provider Notes (Signed)
[facility]-EMERGENCY DEPT Provider Note   CSN: [id number] Arrival date & time: 11/22/16  1455     History   Chief Complaint Chief Complaint  Patient presents with  . Abdominal Pain    HPI Darin White is a 51 y.o. male.  HPI   Pt presents with intermittent RUQ abdominal pain that has occurred after eating for approximately 1 year.  Has, at times, had severe pain, including today.  Associated N/V today only. The symptoms has now resolved.  Has loose stools whenever the RUQ pain flares up.  Pain is brought on with fatty foods.  Pt has changed his diet dramatically over the past year with improvement.   Denies fevers, chills, myalgias, urinary symptoms.   No prior abdominal surgeries.    Past Medical History:  Diagnosis Date  . Bee sting-induced anaphylaxis     Patient Active Problem List   Diagnosis Date Noted  . 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 LOC (loss of consciousness) (HCC) 01/12/2013  . Hyperglycemia 01/12/2013  . Fall 01/12/2013  . Abrasion of face 01/12/2013  . HYPERLIPIDEMIA 09/12/2008  . SHOULDER PAIN, RIGHT 09/12/2008  . PLANTAR FASCIITIS, LEFT 09/12/2008  . OTHER ANXIETY STATES 08/25/2008  . TOBACCO ABUSE 08/25/2008  . PALPITATIONS 08/25/2008  . DYSPNEA ON EXERTION 08/25/2008  . 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 No pertinent surgical history.     Home Medications    Prior to Admission medications   Medication Sig Start Date End Date Taking? Authorizing Provider  diphenhydrAMINE (BENADRYL) 25 mg capsule Take 1 capsule (25 mg total) by mouth every 6 (six) hours as needed for itching. 01/13/13   Ghimire, Werner Lean, MD  EPINEPHrine (EPIPEN) 0.3 mg/0.3 mL SOAJ Inject 0.3 mLs (0.3 mg total) into the muscle once. 01/13/13   Ghimire, Werner Lean, MD  famotidine (PEPCID) 20 MG tablet Take 1 tablet (20 mg total) by mouth 2 (two) times daily. 01/13/13   Ghimire, Werner Lean, MD  HYDROcodone-acetaminophen (NORCO/VICODIN) 5-325 MG tablet  Take 1 tablet by mouth every 4 (four) hours as needed for severe pain. 11/22/16   Trixie Dredge, PA-C  ondansetron (ZOFRAN) 4 MG tablet Take 1 tablet (4 mg total) by mouth every 8 (eight) hours as needed for nausea or vomiting. 11/22/16   Trixie Dredge, PA-C    Family History Family History  Problem Relation Age of Onset  . Cancer Father   . Cancer Mother   . Hypertension Mother             Social History Social History  Substance Use Topics  . Smoking status: Current Every Day Smoker    Packs/day: 1.50    Years: 15.00    Types: Cigarettes  . Smokeless tobacco: Current User  . Alcohol use No     Allergies   Bee venom and Codeine   Review of Systems Review of Systems  All other systems reviewed and are negative.    Physical Exam Updated Vital Signs BP 136/78   Pulse (!) 57   Temp 98.1 F (36.7 C) (Oral)   Resp 16   Ht 5\' 10"  (1.778 m)   Wt 106.2 kg (234 lb 2 oz)   SpO2 97%   BMI 33.59 kg/m   Physical Exam  Constitutional: He appears well-developed and well-nourished. No distress.  HENT:  Head: Normocephalic and atraumatic.  Neck: Neck supple.  Cardiovascular: Normal rate and regular rhythm.   Pulmonary/Chest: Effort normal and breath sounds normal. No respiratory  distress. He has no wheezes. He has no rales.  Abdominal: Soft. He exhibits no distension and no mass. There is no tenderness. There is no rebound, no guarding and negative Murphy's sign.  Neurological: He is alert. He exhibits normal muscle tone.  Skin: He is not diaphoretic.  Nursing note and vitals reviewed.    ED Treatments / Results  Labs (all labs ordered are listed, but only abnormal results are displayed) Labs Reviewed  COMPREHENSIVE METABOLIC PANEL - Abnormal; Notable for the following:       Result Value   Glucose, Bld 101 (*)    All other components within normal limits  URINALYSIS, ROUTINE W REFLEX MICROSCOPIC - Abnormal; Notable for the following:    Color, Urine AMBER (*)     Specific Gravity, Urine 1.031 (*)    Ketones, ur 5 (*)    All other components within normal limits  LIPASE, BLOOD  CBC    EKG  EKG Interpretation None       Radiology [place] Abdomen Complete  Result Date: 11/22/2016 CLINICAL DATA:  Right upper quadrant pain for 2 months, worse today. EXAM: ABDOMEN ULTRASOUND COMPLETE COMPARISON:  None. FINDINGS: Gallbladder: Multiple small polyps are present, measuring up to 5 mm. There are also a echogenic stents measuring up to 5 mm. There is no focal wall thickening or sonographic Murphy's sign. Maximal wall thickness is 2.8 mm, within normal limits. Common bile duct: Diameter: 3.5 mm, within normal limits. Liver: The liver is somewhat echogenic with heterogeneous fatty sparing. No focal lesions are evident. IVC: No abnormality visualized. Pancreas: Visualized portion unremarkable. Spleen: Size and appearance within normal limits. Right Kidney: Length: 14.1 cm, within normal limits. Echogenicity within normal limits. No mass or hydronephrosis visualized. Left Kidney: Length: 13.2 cm, within normal limits. Echogenicity within normal limits. No mass or hydronephrosis visualized. Abdominal aorta: No aneurysm visualized. Other findings: None. IMPRESSION: 1. Cholelithiasis without evidence for cholecystitis. 2. Small gallbladder polyps are also present. 3. Heterogeneous fatty infiltration of the liver without discrete lesions otherwise. Electronically Signed   By: Marin Robertshristopher  Mattern M.D.   On: 11/22/2016 19:51    Procedures Procedures (including critical care time)  Medications Ordered in ED Medications - No data to display   Initial Impression / Assessment and Plan / ED Course  I have reviewed the triage vital signs and the nursing notes.  Pertinent labs & imaging results that were available during my care of the patient were reviewed by me and considered in my medical decision making (see chart for details).     Afebrile, nontoxic patient with  episodic RUQ abdominal pain, particularly with fatty foods.  Today he had more intense pain with N/V.  Asymptomatic in ED.  Labs reassuring, no obstruction, no leukocytosis.  Murphy's sign is negative.  UAunremarkable.  US demonstrates cholelithiasis and gallbladder polyp without complication.    D/C home with symptomatic medication PRN, general surgery referral.   Discussed result, findings, treatment, and follow up  with patient.  Pt given return precautions.  Pt verbalizes understanding and agrees with plan.       Final Clinical Impressions(s) / ED Diagnoses   Final diagnoses:  Calculus of gallbladder without cholecystitis without obstruction  Gallbladder polyp    New Prescriptions New Prescriptions   HYDROCODONE-ACETAMINOPHEN (NORCO/VICODIN) 5-325 MG TABLET    Take 1 tablet by mouth every 4 (four) hours as needed for severe pain.   ONDANSETRON (ZOFRAN) 4 MG TABLET    Take 1 tablet (4 mg total) by  mouth every 8 (eight) hours as needed for nausea or vomiting.     Trixie Dredge, PA-C 11/22/16 2015    Maia Plan, MD 11/23/16 [phone]

## 2016-11-22 NOTE — ED Notes (Signed)
Pt stable, understands discharge instructions, and reasons for return.   

## 2016-11-22 NOTE — ED Triage Notes (Signed)
Pt reports RUQ pain onset x 2 mths worsening today, pain radiates to R mid back, pt reports x 1 liquid stool, nausea, denies emesis, A&O x4

## 2019-01-27 ENCOUNTER — Encounter: Payer: Self-pay | Admitting: Gastroenterology

## 2019-03-04 ENCOUNTER — Encounter: Payer: Self-pay | Admitting: Gastroenterology

## 2019-03-04 ENCOUNTER — Ambulatory Visit (INDEPENDENT_AMBULATORY_CARE_PROVIDER_SITE_OTHER): Payer: BC Managed Care – PPO | Admitting: Gastroenterology

## 2019-03-04 VITALS — BP 140/90 | HR 59 | Temp 98.7°F | Ht 70.0 in | Wt 242.0 lb

## 2019-03-04 DIAGNOSIS — R1011 Right upper quadrant pain: Secondary | ICD-10-CM | POA: Diagnosis not present

## 2019-03-04 DIAGNOSIS — K824 Cholesterolosis of gallbladder: Secondary | ICD-10-CM | POA: Diagnosis not present

## 2019-03-04 DIAGNOSIS — Z1211 Encounter for screening for malignant neoplasm of colon: Secondary | ICD-10-CM

## 2019-03-04 DIAGNOSIS — K8080 Other cholelithiasis without obstruction: Secondary | ICD-10-CM | POA: Diagnosis not present

## 2019-03-04 MED ORDER — NA SULFATE-K SULFATE-MG SULF 17.5-3.13-1.6 GM/177ML PO SOLN: 1.0000 | ORAL | 0 refills | Status: AC

## 2019-03-04 NOTE — Patient Instructions (Addendum)
I have recommended a HIDA scan to further evaluation your gallbladder.   I have also recommended an upper endoscopy to evaluate your abdominal pain.  I have also recommended a colonoscopy for colon cancer screening.   You have been scheduled for a HIDA scan at [facility] (1st floor) on 03-18-2019. Please arrive 30 minutes prior to your scheduled appointment at  7:30. Make certain not to have anything to eat or drink at least 6 hours prior to your test. Should this appointment date or time not work well for you, please call radiology scheduling at [phone].  _____________________________________________________________________ hepatobiliary (HIDA) scan is an imaging procedure used to diagnose problems in the liver, gallbladder and bile ducts. In the HIDA scan, a radioactive chemical or tracer is injected into a vein in your arm. The tracer is handled by the liver like bile. Bile is a fluid produced and excreted by your liver that helps your digestive system break down fats in the foods you eat. Bile is stored in your gallbladder and the gallbladder releases the bile when you eat a meal. A special nuclear medicine scanner (gamma camera) tracks the flow of the tracer from your liver into your gallbladder and small intestine.  During your HIDA scan  You'll be asked to change into a hospital gown before your HIDA scan begins. Your health care team will position you on a table, usually on your back. The radioactive tracer is then injected into a vein in your arm.The tracer travels through your bloodstream to your liver, where it's taken up by the bile-producing cells. The radioactive tracer travels with the bile from your liver into your gallbladder and through your bile ducts to your small intestine.You may feel some pressure while the radioactive tracer is injected into your vein. As you lie on the table, a special gamma camera is positioned over your abdomen taking pictures of the tracer as it  moves through your body. The gamma camera takes pictures continually for about an hour. You'll need to keep still during the HIDA scan. This can become uncomfortable, but you may find that you can lessen the discomfort by taking deep breaths and thinking about other things. Tell your health care team if you're uncomfortable. The radiologist will watch on a computer the progress of the radioactive tracer through your body. The HIDA scan may be stopped when the radioactive tracer is seen in the gallbladder and enters your small intestine. This typically takes about an hour. In some cases extra imaging will be performed if original images aren't satisfactory, if morphine is given to help visualize the gallbladder or if the medication CCK is given to look at the contraction of the gallbladder. This test typically takes 2 hours to complete. ________________________________________________________________________

## 2019-03-04 NOTE — Progress Notes (Signed)
Referring Provider: Meda Coffee, DO Primary Care Physician:  Artist Pais, Doe-Hyun R, DO  Reason for Consultation:  Gallbladder   IMPRESSION:  Post-prandial RUQ pain x 8-10 years    - no source identified on ultrasound in 2018 or 2020    -Worse after eating fried or greasy foods Cholelithiasis on ultrasound in 2018 Gallbladder polyps - appears to be stable between 2018 and 2020 No prior colon cancer screening No known family history of colon cancer or polyps   The differential for chronic RUQ abdominal pain without alarm features. Ultrasound shows no obvious hepatobiliary cause, although gallstones were present on ultrasound in 2018. Differential also includes PUD, esophagitis, gastritis, and  functional abdominal pain. Given this differential, I am recommending a HIDA scan with CCK and upper endoscopy to evaluate his pain.  Colonoscopy recommended for colon cancer screening.    PLAN: HIDA with CCK EGD to evaluate his abdominal pain Colonoscopy for colon cancer screening  I consented the patient at the bedside today discussing the risks, benefits, and alternatives to endoscopic evaluation. In particular, we discussed the risks that include, but are not limited to, reaction to medication, cardiopulmonary compromise, bleeding requiring blood transfusion, aspiration resulting in pneumonia, perforation requiring surgery, lack of diagnosis, severe illness requiring hospitalization, and even death. We reviewed the risk of missed lesion including polyps or even cancer. The patient acknowledges these risks and asks that we proceed.  Please see the "Patient Instructions" section for addition details about the plan.  HPI: Darin White is a 53 y.o. male referred by Dr. Ardelle Park for further evaluation of RUQ pain.  The history is obtained through the patient and review of referral records from Dr. Ardelle Park as well as review of his electronic health record.  RUQ x 8-10 years. At the rib cage and radiates  to the back. Occurs after greasy or spicy foods. Not daily, only occurs with eating.  Intensity of pain fluctuates.  Occasionally has associated nausea. He thinks it's his gallbladder.  Diarrhea during episodes.  Asymptomatic between episodes.  Prior ER visit for abdominal pain in 2018. Ultrasound showed stones. He did not have insurance and did not follow-up with surgery.   Using his wife's NSAIDs for back pain. Avoids all almost all medications.  Has worked hard to improve his diet without any change in his symptoms.  No prior endoscopy or colon cancer screening.   Abdominal imaging: Abdominal ultrasound 11/22/16: cholelithiasis, small gallbladder polyps; fatty liver  Abdominal ultrasound 01/20/19: 2 small echogenic foci along the gallbladder wall likely polyps; no gallstones. No biliary diltation.   Labs: 11/22/2016: Normal comprehensive metabolic panel including normal liver enzymes, lipase 34, white count 6.5, hemoglobin 16.9, platelets 201  Wife with similar symptoms that ultimately turned out to be a ruptured appendix.   Both parents had gallbladder issues, ulcers requiring surgery, and lung cancer.  No known family history of colon cancer or polyps. No family history of uterine/endometrial cancer, pancreatic cancer or gastric/stomach cancer.   Past Medical History:  Diagnosis Date  . Bee sting-induced anaphylaxis     Past Surgical History:  Procedure Laterality Date  . arm surgery     left arm    Current Outpatient Medications  Medication Sig Dispense Refill  . ALPRAZolam (XANAX) 0.25 MG tablet Take 0.25 mg by mouth as needed for anxiety.    . cephALEXin (KEFLEX) 500 MG capsule Take 500 mg by mouth 4 (four) times daily.    . diphenhydrAMINE (BENADRYL) 25 mg capsule Take 1  capsule (25 mg total) by mouth every 6 (six) hours as needed for itching. 30 capsule 0  . EPINEPHrine (EPIPEN) 0.3 mg/0.3 mL SOAJ Inject 0.3 mLs (0.3 mg total) into the muscle once. 5 Device 0  .  methylphenidate (RITALIN) 10 MG tablet Take 10 mg by mouth as needed.     No current facility-administered medications for this visit.     Allergies as of 03/04/2019 - Review Complete 03/04/2019  Allergen Reaction Noted  . Bee venom Swelling 01/12/2013  . Codeine Nausea And Vomiting 01/12/2013    Family History  Problem Relation Age of Onset  . Cancer Father   . Cancer Mother   . Hypertension Mother             Social History   Socioeconomic History  . Marital status: Married    Spouse name: Not on file  . Number of children: Not on file  . Years of education: Not on file  . Highest education level: Not on file  Occupational History  . Not on file  Social Needs  . Financial resource strain: Not on file  . Food insecurity    Worry: Not on file    Inability: Not on file  . Transportation needs    Medical: Not on file    Non-medical: Not on file  Tobacco Use  . Smoking status: Current Every Day Smoker    Packs/day: 1.50    Years: 15.00    Pack years: 22.50    Types: Cigarettes  . Smokeless tobacco: Current User  Substance and Sexual Activity  . Alcohol use: No  . Drug use: No  . Sexual activity: Yes  Lifestyle  . Physical activity    Days per week: Not on file    Minutes per session: Not on file  . Stress: Not on file  Relationships  . Social Musicianconnections    Talks on phone: Not on file    Gets together: Not on file    Attends religious service: Not on file    Active member of club or organization: Not on file    Attends meetings of clubs or organizations: Not on file    Relationship status: Not on file  . Intimate partner violence    Fear of current or ex partner: Not on file    Emotionally abused: Not on file    Physically abused: Not on file    Forced sexual activity: Not on file  Other Topics Concern  . Not on file  Social History Narrative  . Not on file    Review of Systems: 12 system ROS is negative except as noted above with the additions of  allergies, anxiety, back pain, itching muscle pain, skin rash, insomnia.   Physical Exam: General:   Alert,  well-nourished, pleasant and cooperative in NAD Head:  Normocephalic and atraumatic. Eyes:  Sclera clear, no icterus.   Conjunctiva pink. Ears:  Normal auditory acuity. Nose:  No deformity, discharge,  or lesions. Mouth:  No deformity or lesions.   Neck:  Supple; no masses or thyromegaly. Lungs:  Clear throughout to auscultation.   No wheezes. Heart:  Regular rate and rhythm; no murmurs. Abdomen:  Soft, small ventral hernia, nontender, nondistended, normal bowel sounds, no rebound or guarding. No hepatosplenomegaly.  Unable to reproduce his symptoms. Rectal:  Deferred  Msk:  Symmetrical. No boney deformities LAD: No inguinal or umbilical LAD Extremities:  No clubbing or edema. Neurologic:  Alert and  oriented x4;  grossly  nonfocal Skin:  Intact without significant lesions or rashes. Psych:  Alert and cooperative. Normal mood and affect.   Aireana Ryland L. Tarri Glenn, MD, MPH 03/04/2019, 9:39 AM

## 2019-03-17 ENCOUNTER — Encounter: Payer: Self-pay | Admitting: Gastroenterology

## 2019-03-18 ENCOUNTER — Ambulatory Visit (HOSPITAL_COMMUNITY): Payer: BC Managed Care – PPO

## 2019-03-26 ENCOUNTER — Encounter: Payer: BC Managed Care – PPO | Admitting: Gastroenterology

## 2019-08-07 ENCOUNTER — Other Ambulatory Visit: Payer: Self-pay

## 2019-08-07 ENCOUNTER — Encounter (HOSPITAL_COMMUNITY): Payer: Self-pay

## 2019-08-07 ENCOUNTER — Ambulatory Visit (HOSPITAL_COMMUNITY)
Admission: EM | Admit: 2019-08-07 | Discharge: 2019-08-07 | Disposition: A | Discharge status: Home / Self Care | Payer: BC Managed Care – PPO

## 2019-08-07 DIAGNOSIS — H6991 Unspecified Eustachian tube disorder, right ear: Secondary | ICD-10-CM

## 2019-08-07 DIAGNOSIS — S161XXA Strain of muscle, fascia and tendon at neck level, initial encounter: Secondary | ICD-10-CM

## 2019-08-07 DIAGNOSIS — J320 Chronic maxillary sinusitis: Secondary | ICD-10-CM

## 2019-08-07 DIAGNOSIS — H6981 Other specified disorders of Eustachian tube, right ear: Secondary | ICD-10-CM

## 2019-08-07 HISTORY — DX: Arteritis, unspecified: I77.6

## 2019-08-07 HISTORY — DX: Unspecified intracranial injury with loss of consciousness of unspecified duration, initial encounter: S06.9X9A

## 2019-08-07 HISTORY — DX: Spotted fever due to Rickettsia rickettsii: A77.0

## 2019-08-07 HISTORY — DX: Anxiety disorder, unspecified: F41.9

## 2019-08-07 HISTORY — DX: Unspecified intracranial injury with loss of consciousness status unknown, initial encounter: S06.9XAA

## 2019-08-07 NOTE — ED Triage Notes (Signed)
Pt c/o swelling to right side of neck approx 3 days, also c/o right ear pain for approx 1 week, sinus congestion, right upper jaw pain.   Pt states he has h/o tbi with subsequent panic attacks, took 0.25 of xanax before coming. 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

## 2019-08-07 NOTE — Discharge Instructions (Signed)
Try the Flonase for symptoms Watch the neck area most likely strained muscle.  Follow up as needed for continued or worsening symptoms

## 2019-08-08 NOTE — ED Provider Notes (Addendum)
[facility]    CSN: [id number] Arrival date & time: 08/07/19  [place]      History   Chief Complaint Chief Complaint  Patient presents with  . Neck Pain  . Otalgia    HPI Koron Godeaux is a 54 y.o. male.   Patient is a 54 year old male with past medical history of anxiety, recommend spotted fever, traumatic brain injury, vasculitis.  He presents today with right sided anterior neck pain "swelling".  He noticed this approximate 3 days ago.  He is also had some mild right ear pain pain behind the right ear, sinus congestion, sinus pressure x1 week. Denies any injuries to the neck.  Concern for vasculitis.  No redness to the area or rash.  Reporting he is work been working more than usual the past week and doing a lot of heavy lifting with the right arm due to having limited mobility and weakness in the left arm.  The left arm issue is chronic for him.  This whole situation has made him very anxious so he reporting he took 0.25 of Xanax before coming due to history of panic attacks.  No fever, chills, body aches, injuries.  ROS per HPI        Past Medical History:  Diagnosis Date  . Anxiety   . Bee sting-induced anaphylaxis   . [facility] spotted fever   . Traumatic brain injury ([place])   . Vasculitis [facility])     Patient Active Problem List   Diagnosis Date Noted  . 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 LOC (loss of consciousness) ([place]) 01/12/2013  . Hyperglycemia 01/12/2013  . Fall 01/12/2013  . Abrasion of face 01/12/2013  . HYPERLIPIDEMIA 09/12/2008  . SHOULDER PAIN, RIGHT 09/12/2008  . PLANTAR FASCIITIS, LEFT 09/12/2008  . OTHER ANXIETY STATES 08/25/2008  . TOBACCO ABUSE 08/25/2008  . PALPITATIONS 08/25/2008  . DYSPNEA ON EXERTION 08/25/2008  . NEPHROLITHIASIS, HX OF 08/25/2008    Past Surgical History:  Procedure Laterality Date  . arm surgery     left arm       Home Medications    Prior to Admission medications   Medication  Sig Start Date End Date Taking? Authorizing Provider  ALPRAZolam Duanne Moron) 0.25 MG tablet Take 0.25 mg by mouth as needed for anxiety.   Yes [provider]  methylphenidate (RITALIN) 10 MG tablet Take 10 mg by mouth as needed.   Yes [provider]  diphenhydrAMINE (BENADRYL) 25 mg capsule Take 1 capsule (25 mg total) by mouth every 6 (six) hours as needed for itching. 01/13/13   Ghimire, Henreitta Leber, MD  EPINEPHrine (EPIPEN) 0.3 mg/0.3 mL SOAJ Inject 0.3 mLs (0.3 mg total) into the muscle once. 01/13/13   Ghimire, Henreitta Leber, MD    Family History Family History  Problem Relation Age of Onset  . Cancer Father   . Cancer Mother   . Hypertension Mother             Social History Social History   Tobacco Use  . Smoking status: Current Every Day Smoker    Packs/day: 1.50    Years: 15.00    Pack years: 22.50    Types: Cigarettes  . Smokeless tobacco: Current User  Substance Use Topics  . Alcohol use: No  . Drug use: No     Allergies   Codeine and Bee venom   Review of Systems Review of Systems   Physical Exam Triage Vital Signs ED Triage Vitals  Enc Vitals Group     BP 08/07/19 1841 (!) 170/113     Pulse Rate 08/07/19 1841 68     Resp 08/07/19 1841 18     Temp 08/07/19 1841 98.2 F (36.8 C)     Temp Source 08/07/19 1841 Oral     SpO2 08/07/19 1841 98 %     Weight --      Height --      Head Circumference --      Peak Flow --      Pain Score 08/07/19 1838 6     Pain Loc --      Pain Edu? --      Excl. in GC? --    No data found.  Updated Vital Signs BP (!) 170/113 (BP Location: Right Arm)   Pulse 68   Temp 98.2 F (36.8 C) (Oral)   Resp 18   SpO2 98%   Visual Acuity Right Eye Distance:   Left Eye Distance:   Bilateral Distance:    Right Eye Near:   Left Eye Near:    Bilateral Near:     Physical Exam Vitals and nursing note reviewed.  Constitutional:      General: He is not in acute distress.    Appearance: Normal appearance. He  is not ill-appearing, toxic-appearing or diaphoretic.  HENT:     Head: Normocephalic and atraumatic.     Right Ear: Tympanic membrane and ear canal normal.     Left Ear: Tympanic membrane and ear canal normal.     Nose: Nose normal.     Mouth/Throat:     Pharynx: Oropharynx is clear.  Eyes:     Conjunctiva/sclera: Conjunctivae normal.  Neck:     Thyroid: No thyromegaly or thyroid tenderness.     Trachea: Trachea normal.      Comments: Area where patient describes his feeling the discomfort.  No redness, swelling, rash Patient pointing to and grabbing at muscular area Cardiovascular:     Rate and Rhythm: Normal rate and regular rhythm.  Pulmonary:     Effort: Pulmonary effort is normal.     Breath sounds: Normal breath sounds.  Musculoskeletal:        General: Normal range of motion.     Cervical back: Normal range of motion. No erythema. Normal range of motion.  Lymphadenopathy:     Cervical: No cervical adenopathy.  Skin:    General: Skin is warm and dry.     Findings: No erythema or rash.  Neurological:     Mental Status: He is alert.  Psychiatric:        Mood and Affect: Mood normal.      UC Treatments / Results  Labs (all labs ordered are listed, but only abnormal results are displayed) Labs Reviewed - No data to display  EKG   Radiology No results found.  Procedures Procedures (including critical care time)  Medications Ordered in UC Medications - No data to display  Initial Impression / Assessment and Plan / UC Course  I have reviewed the triage vital signs and the nursing notes.  Pertinent labs & imaging results that were available during my care of the patient were reviewed by me and considered in my medical decision making (see chart for details).     Strain of neck muscle-area of patient's discomfort is the sternocleidomastoid muscle.  There is no redness, swelling or deformities.  There is no rash.  No concern for vasculitis like patient was  concerned about Most likely strained muscle  Eustachian tube dysfunction and sinusitis-treating with Flonase  Follow up as needed for continued or worsening symptoms  Spent 20 minutes discussing multiple issues with patient. Patient reassured Final Clinical Impressions(s) / UC Diagnoses   Final diagnoses:  Strain of neck muscle, initial encounter  Eustachian tube dysfunction, right  Chronic maxillary sinusitis     Discharge Instructions     Try the Flonase for symptoms Watch the neck area most likely strained muscle.  Follow up as needed for continued or worsening symptoms     ED Prescriptions    None     PDMP not reviewed this encounter.   Janace Aris, NP 08/08/19 1434    Dahlia Byes A, NP 08/08/19 1435

## 2020-04-11 ENCOUNTER — Encounter (HOSPITAL_COMMUNITY): Payer: Self-pay | Admitting: Emergency Medicine

## 2020-04-11 ENCOUNTER — Emergency Department (HOSPITAL_COMMUNITY): Payer: BC Managed Care – PPO

## 2020-04-11 ENCOUNTER — Emergency Department (HOSPITAL_COMMUNITY)
Admission: EM | Admit: 2020-04-11 | Discharge: 2020-04-11 | Disposition: A | Discharge status: Home / Self Care | Payer: BC Managed Care – PPO | Attending: Emergency Medicine | Admitting: Emergency Medicine

## 2020-04-11 ENCOUNTER — Other Ambulatory Visit: Payer: Self-pay

## 2020-04-11 DIAGNOSIS — R1011 Right upper quadrant pain: Secondary | ICD-10-CM | POA: Diagnosis present

## 2020-04-11 DIAGNOSIS — R1013 Epigastric pain: Secondary | ICD-10-CM | POA: Diagnosis not present

## 2020-04-11 DIAGNOSIS — I2510 Atherosclerotic heart disease of native coronary artery without angina pectoris: Secondary | ICD-10-CM | POA: Insufficient documentation

## 2020-04-11 DIAGNOSIS — Z87442 Personal history of urinary calculi: Secondary | ICD-10-CM | POA: Insufficient documentation

## 2020-04-11 DIAGNOSIS — F17210 Nicotine dependence, cigarettes, uncomplicated: Secondary | ICD-10-CM | POA: Insufficient documentation

## 2020-04-11 LAB — CBC
HCT: 48.4 % (ref 39.0–52.0)
Hemoglobin: 16.3 g/dL (ref 13.0–17.0)
MCH: 30.4 pg (ref 26.0–34.0)
MCHC: 33.7 g/dL (ref 30.0–36.0)
MCV: 90.1 fL (ref 80.0–100.0)
Platelets: 202 10*3/uL (ref 150–400)
RBC: 5.37 MIL/uL (ref 4.22–5.81)
RDW: 12.6 % (ref 11.5–15.5)
WBC: 4.6 10*3/uL (ref 4.0–10.5)
nRBC: 0 % (ref 0.0–0.2)

## 2020-04-11 LAB — COMPREHENSIVE METABOLIC PANEL
ALT: 29 U/L (ref 0–44)
AST: 23 U/L (ref 15–41)
Albumin: 4.1 g/dL (ref 3.5–5.0)
Alkaline Phosphatase: 61 U/L (ref 38–126)
Anion gap: 10 (ref 5–15)
BUN: 9 mg/dL (ref 6–20)
CO2: 21 mmol/L — ABNORMAL LOW (ref 22–32)
Calcium: 8.8 mg/dL — ABNORMAL LOW (ref 8.9–10.3)
Chloride: 108 mmol/L (ref 98–111)
Creatinine, Ser: 1.05 mg/dL (ref 0.61–1.24)
GFR, Estimated: >60 mL/min (ref >60)
Glucose, Bld: 109 mg/dL — ABNORMAL HIGH (ref 70–99)
Potassium: 4 mmol/L (ref 3.5–5.1)
Sodium: 139 mmol/L (ref 135–145)
Total Bilirubin: 0.7 mg/dL (ref 0.3–1.2)
Total Protein: 6.4 g/dL — ABNORMAL LOW (ref 6.5–8.1)

## 2020-04-11 LAB — URINALYSIS, ROUTINE W REFLEX MICROSCOPIC
Bilirubin Urine: NEGATIVE
Glucose, UA: NEGATIVE mg/dL
Hgb urine dipstick: NEGATIVE
Ketones, ur: NEGATIVE mg/dL
Leukocytes,Ua: NEGATIVE
Nitrite: NEGATIVE
Protein, ur: NEGATIVE mg/dL
Specific Gravity, Urine: 1.021 (ref 1.005–1.030)
pH: 7 (ref 5.0–8.0)

## 2020-04-11 LAB — LIPASE, BLOOD: Lipase: 33 U/L (ref 11–51)

## 2020-04-11 LAB — TROPONIN I (HIGH SENSITIVITY): Troponin I (High Sensitivity): 6 ng/L (ref <18)

## 2020-04-11 MED ORDER — LORAZEPAM 1 MG PO TABS
1.0000 mg | ORAL_TABLET | Freq: Once | ORAL | Status: AC
  Administered 2020-04-11: 1 mg via ORAL
  Filled 2020-04-11: qty 1

## 2020-04-11 MED ORDER — FENTANYL CITRATE (PF) 100 MCG/2ML IJ SOLN
50.0000 ug | Freq: Once | INTRAMUSCULAR | Status: AC
  Administered 2020-04-11: 50 ug via INTRAVENOUS
  Filled 2020-04-11: qty 2

## 2020-04-11 MED ORDER — ONDANSETRON HCL 4 MG/2ML IJ SOLN
4.0000 mg | Freq: Once | INTRAMUSCULAR | Status: AC
  Administered 2020-04-11: 4 mg via INTRAVENOUS
  Filled 2020-04-11: qty 2

## 2020-04-11 NOTE — Discharge Instructions (Signed)
Please follow-up with your cardiologist as well as your primary care doctor.  Return to ER if you develop chest pain, difficulty breathing, worsening abdominal pain, vomiting or other new concerning symptom.

## 2020-04-11 NOTE — ED Triage Notes (Signed)
Pt endorses right sided abd pain for 3-4 days. Endorses nausea this AM. Possible hx of gallstones.

## 2020-04-11 NOTE — ED Notes (Signed)
Pt reports having a panic attack around 1300, his BP reflects an increase during this & the pt was given ativan per EDP order. Pt states he is much calmer at this time but remains slightly tremulous in his bilateral hands.

## 2020-04-12 NOTE — ED Provider Notes (Signed)
[facility] EMERGENCY DEPARTMENT Provider Note   CSN: [id number] Arrival date & time: 04/11/20  1004     History Chief Complaint  Patient presents with  . Abdominal Pain    Jaysin Gayler is a 54 y.o. male.  Presents to the emergency room with concern for right-sided abdominal pain.  Patient reports that he has episodes of right upper abdominal pain for many years.  Reports he has been told at one point he had gallstones but then later told he does not have gallstones.  Still has his gallbladder.  Reports symptoms have been ongoing for the last few days, episodes of abdominal pain seem to come and go at random.  Not associated with eating, not associated with bowel movements.  No diarrhea, constipation, no nausea or vomiting.  No chest pain or difficulty in breathing.  Per chart review through care everywhere, patient in June of this year had NSTEMI, stent placement.  Patient reports that he has stopped all his regular medications including effient.  Patient reports that when he had the heart attack, he had indigestion, central chest discomfort, felt very different than the symptoms he is experiencing today.  HPI     Past Medical History:  Diagnosis Date  . Anxiety   . Bee sting-induced anaphylaxis   . [facility] spotted fever   . Traumatic brain injury (HCC)   . Vasculitis [facility])     Patient Active Problem List   Diagnosis Date Noted  . 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 LOC (loss of consciousness) (HCC) 01/12/2013  . Hyperglycemia 01/12/2013  . Fall 01/12/2013  . Abrasion of face 01/12/2013  . HYPERLIPIDEMIA 09/12/2008  . SHOULDER PAIN, RIGHT 09/12/2008  . PLANTAR FASCIITIS, LEFT 09/12/2008  . OTHER ANXIETY STATES 08/25/2008  . TOBACCO ABUSE 08/25/2008  . PALPITATIONS 08/25/2008  . DYSPNEA ON EXERTION 08/25/2008  . NEPHROLITHIASIS, HX OF 08/25/2008    Past Surgical History:  Procedure Laterality Date  . arm surgery     left  arm       Family History  Problem Relation Age of Onset  . Cancer Father   . Cancer Mother   . Hypertension Mother             Social History   Tobacco Use  . Smoking status: Current Every Day Smoker    Packs/day: 1.50    Years: 15.00    Pack years: 22.50    Types: Cigarettes  . Smokeless tobacco: Current User  Vaping Use  . Vaping Use: Never used  Substance Use Topics  . Alcohol use: No  . Drug use: No    Home Medications Prior to Admission medications   Medication Sig Start Date End Date Taking? Authorizing Provider  ALPRAZolam Prudy Feeler) 0.25 MG tablet Take 0.25 mg by mouth as needed for anxiety.    [provider]  diphenhydrAMINE (BENADRYL) 25 mg capsule Take 1 capsule (25 mg total) by mouth every 6 (six) hours as needed for itching. 01/13/13   Ghimire, Werner Lean, MD  EPINEPHrine (EPIPEN) 0.3 mg/0.3 mL SOAJ Inject 0.3 mLs (0.3 mg total) into the muscle once. 01/13/13   Ghimire, Werner Lean, MD  methylphenidate (RITALIN) 10 MG tablet Take 10 mg by mouth as needed.    [provider]    Allergies    Codeine and Bee venom  Review of Systems   Review of Systems  Constitutional: Negative for chills and fever.  HENT: Negative for ear pain and  sore throat.   Eyes: Negative for pain and visual disturbance.  Respiratory: Negative for cough and shortness of breath.   Cardiovascular: Negative for chest pain and palpitations.  Gastrointestinal: Positive for abdominal pain. Negative for vomiting.  Genitourinary: Negative for dysuria and hematuria.  Musculoskeletal: Negative for arthralgias and back pain.  Skin: Negative for color change and rash.  Neurological: Negative for seizures and syncope.  All other systems reviewed and are negative.   Physical Exam Updated Vital Signs BP 136/90   Pulse (!) 50   Temp 97.6 F (36.4 C) (Oral)   Resp 19   Ht 5\' 10"  (1.778 m)   Wt 104.3 kg   SpO2 97%   BMI 33.00 kg/m   Physical Exam Vitals and nursing note  reviewed.  Constitutional:      Appearance: He is well-developed.  HENT:     Head: Normocephalic and atraumatic.  Eyes:     Conjunctiva/sclera: Conjunctivae normal.  Cardiovascular:     Rate and Rhythm: Normal rate and regular rhythm.     Heart sounds: No murmur heard.   Pulmonary:     Effort: Pulmonary effort is normal. No respiratory distress.     Breath sounds: Normal breath sounds.  Abdominal:     Palpations: Abdomen is soft.     Tenderness: There is no abdominal tenderness.  Musculoskeletal:     Cervical back: Neck supple.  Skin:    General: Skin is warm and dry.  Neurological:     General: No focal deficit present.     Mental Status: He is alert and oriented to person, place, and time.     ED Results / Procedures / Treatments   Labs (all labs ordered are listed, but only abnormal results are displayed) Labs Reviewed  COMPREHENSIVE METABOLIC PANEL - Abnormal; Notable for the following components:      Result Value   CO2 21 (*)    Glucose, Bld 109 (*)    Calcium 8.8 (*)    Total Protein 6.4 (*)    All other components within normal limits  LIPASE, BLOOD  CBC  URINALYSIS, ROUTINE W REFLEX MICROSCOPIC  TROPONIN I (HIGH SENSITIVITY)    EKG EKG Interpretation  Date/Time:  Tuesday April 11 2020 12:17:01 EDT Ventricular Rate:  55 PR Interval:    QRS Duration: 107 QT Interval:  463 QTC Calculation: 443 R Axis:   69 Text Interpretation: Sinus rhythm Confirmed by [id number] (Marianna Fuss) on 04/11/2020 12:44:37 PM   Radiology 04/13/2020 Abdomen Limited  Result Date: 04/11/2020 CLINICAL DATA:  Right upper quadrant pain EXAM: ULTRASOUND ABDOMEN LIMITED RIGHT UPPER QUADRANT COMPARISON:  None. FINDINGS: Gallbladder: No definite gallstones or wall thickening visualized. There is a 5 mm polyp. Additional echogenic 4 mm focus along the gallbladder wall does not shadow and does not move, favored to represent an additional polyp. No sonographic Murphy sign noted by  sonographer. Common bile duct: Diameter: 3.6 mm, within normal limits Liver: The left lobe is not visualized due to overlying bowel gas. No focal lesion identified. Within normal limits in parenchymal echogenicity. Portal vein is patent on color Doppler imaging with normal direction of blood flow towards the liver. IMPRESSION: 1. No evidence of acute cholecystitis. 2. Multiple small (less than 6 mm) gallbladder polyps. 3. Mildly increased liver echogenicity, suggestive of hepatic steatosis. Electronically Signed   By: 04/13/2020 MD   On: 04/11/2020 15:09    Procedures Procedures (including critical care time)  Medications Ordered in ED Medications  fentaNYL (  SUBLIMAZE) injection 50 mcg (50 mcg Intravenous Given 04/11/20 1210)  ondansetron (ZOFRAN) injection 4 mg (4 mg Intravenous Given 04/11/20 1231)  LORazepam (ATIVAN) tablet 1 mg (1 mg Oral Given 04/11/20 1301)    ED Course  I have reviewed the triage vital signs and the nursing notes.  Pertinent labs & imaging results that were available during my care of the patient were reviewed by me and considered in my medical decision making (see chart for details).  Clinical Course as of Apr 12 1633  Tue Apr 11, 2020  1523 Rechecked, symptoms resolved, will discharge home   [RD]    Clinical Course User Index [RD] Milagros Loll, MD   MDM Rules/Calculators/A&P                         54 year old male notable recent history of NSTEMI, CAD presented to the emergency room with concern for right upper quadrant abdominal pain.  On physical exam patient noted to be remarkably well-appearing in no distress, his abdominal exam was reassuring, noted very mild tenderness in his right upper quadrant and epigastric region.  His labs are notable for normal LFTs, normal lipase.  Right upper quadrant ultrasound negative for cholelithiasis or cholecystitis.  EKG without acute ischemic change, troponin normal limits, doubt ACS.  Recommend patient  follow-up with his primary doctor regarding his symptoms from today.  Counseled patient on importance of taking his prescribed medications, particularly his heart medications.  Patient is very reluctant to restart his medications, strongly recommended restarting and talking to his cardiologist and primary doctor about this.  After the discussed management above, the patient was determined to be safe for discharge.  The patient was in agreement with this plan and all questions regarding their care were answered.  ED return precautions were discussed and the patient will return to the ED with any significant worsening of condition.    Final Clinical Impression(s) / ED Diagnoses Final diagnoses:  Epigastric abdominal pain    Rx / DC Orders ED Discharge Orders    None       Milagros Loll, MD 04/12/20 [phone]

## 2020-05-17 ENCOUNTER — Ambulatory Visit: Payer: BC Managed Care – PPO | Admitting: Physician Assistant

## 2020-06-01 ENCOUNTER — Ambulatory Visit: Payer: BC Managed Care – PPO | Admitting: Gastroenterology

## 2020-07-20 ENCOUNTER — Encounter (HOSPITAL_COMMUNITY): Payer: Self-pay | Admitting: Emergency Medicine

## 2020-07-20 ENCOUNTER — Emergency Department (HOSPITAL_COMMUNITY): Payer: BC Managed Care – PPO

## 2020-07-20 ENCOUNTER — Emergency Department (HOSPITAL_COMMUNITY)
Admission: EM | Admit: 2020-07-20 | Discharge: 2020-07-20 | Disposition: A | Discharge status: Home / Self Care | Payer: BC Managed Care – PPO | Attending: Emergency Medicine | Admitting: Emergency Medicine

## 2020-07-20 DIAGNOSIS — R1011 Right upper quadrant pain: Secondary | ICD-10-CM

## 2020-07-20 DIAGNOSIS — F17210 Nicotine dependence, cigarettes, uncomplicated: Secondary | ICD-10-CM | POA: Insufficient documentation

## 2020-07-20 LAB — CBC
HCT: 47.7 % (ref 39.0–52.0)
Hemoglobin: 16.9 g/dL (ref 13.0–17.0)
MCH: 31.7 pg (ref 26.0–34.0)
MCHC: 35.4 g/dL (ref 30.0–36.0)
MCV: 89.5 fL (ref 80.0–100.0)
Platelets: 212 10*3/uL (ref 150–400)
RBC: 5.33 MIL/uL (ref 4.22–5.81)
RDW: 12.3 % (ref 11.5–15.5)
WBC: 5.5 10*3/uL (ref 4.0–10.5)
nRBC: 0 % (ref 0.0–0.2)

## 2020-07-20 LAB — COMPREHENSIVE METABOLIC PANEL
ALT: 20 U/L (ref 0–44)
AST: 16 U/L (ref 15–41)
Albumin: 4.3 g/dL (ref 3.5–5.0)
Alkaline Phosphatase: 54 U/L (ref 38–126)
Anion gap: 10 (ref 5–15)
BUN: 12 mg/dL (ref 6–20)
CO2: 23 mmol/L (ref 22–32)
Calcium: 9.1 mg/dL (ref 8.9–10.3)
Chloride: 104 mmol/L (ref 98–111)
Creatinine, Ser: 1.04 mg/dL (ref 0.61–1.24)
GFR, Estimated: >60 mL/min (ref >60)
Glucose, Bld: 94 mg/dL (ref 70–99)
Potassium: 4.3 mmol/L (ref 3.5–5.1)
Sodium: 137 mmol/L (ref 135–145)
Total Bilirubin: 0.9 mg/dL (ref 0.3–1.2)
Total Protein: 6.6 g/dL (ref 6.5–8.1)

## 2020-07-20 LAB — LIPASE, BLOOD: Lipase: 34 U/L (ref 11–51)

## 2020-07-20 MED ORDER — IOHEXOL 300 MG/ML  SOLN
100.0000 mL | Freq: Once | INTRAMUSCULAR | Status: AC | PRN
  Administered 2020-07-20: 100 mL via INTRAVENOUS

## 2020-07-20 MED ORDER — KETOROLAC TROMETHAMINE 30 MG/ML IJ SOLN
30.0000 mg | Freq: Once | INTRAMUSCULAR | Status: AC
  Administered 2020-07-20: 30 mg via INTRAVENOUS
  Filled 2020-07-20: qty 1

## 2020-07-20 NOTE — ED Triage Notes (Signed)
Pt reports RLQ pain chronic for 5 years with flares that come and once was told he had gall stones. This pain currently started last week and pain seems to come and go not related to eating.

## 2020-07-20 NOTE — ED Notes (Signed)
Pt to CT

## 2020-07-20 NOTE — ED Provider Notes (Signed)
[name]   CSN: [id number] Arrival date & time: 07/20/20  1310     History Chief Complaint  Patient presents with  . Abdominal Pain    Darin White is a 55 y.o. male presents with right-sided abdominal pain x8 days.  Patient with history of the same x5 years, intermittently, however states that he has been sharp, stabbing, and constant for the last 8 days.  He has been using over-the-counter medications without much relief at home.  Endorses increase in flatulence, most recent bowel movement was this morning, denies melena or hematochezia.  Denies nausea or vomiting, denies chest pain, shortness of breath, palpitations.  States he has an appointment with [facility] in 07/27/2020, however states pain is too severe to wait for that appointment.  He has been diagnosed with gallbladder polyps in the past, has never had cholecystitis.  States his pain is not correlated to eating.  I personally reviewed patient's medical records.  Has history of chronic right-sided abdominal pain, and recommend spotted fever.  History of NSTEMI 18/2021, additionally had COVID-19 infection and 06/2019 and 01/2020.  Patient is not vaccinated with COVID-19.   HPI     Past Medical History:  Diagnosis Date  . Anxiety   . Bee sting-induced anaphylaxis   . [facility] spotted fever   . Traumatic brain injury (HCC)   . Vasculitis [facility])     Patient Active Problem List   Diagnosis Date Noted  . 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 LOC (loss of consciousness) (HCC) 01/12/2013  . Hyperglycemia 01/12/2013  . Fall 01/12/2013  . Abrasion of face 01/12/2013  . HYPERLIPIDEMIA 09/12/2008  . SHOULDER PAIN, RIGHT 09/12/2008  . PLANTAR FASCIITIS, LEFT 09/12/2008  . OTHER ANXIETY STATES 08/25/2008  . TOBACCO ABUSE 08/25/2008  . PALPITATIONS 08/25/2008  . DYSPNEA ON EXERTION 08/25/2008  . NEPHROLITHIASIS, HX OF 08/25/2008    Past Surgical  History:  Procedure Laterality Date  . arm surgery     left arm       Family History  Problem Relation Age of Onset  . Cancer Father   . Cancer Mother   . Hypertension Mother             Social History   Tobacco Use  . Smoking status: Current Every Day Smoker    Packs/day: 1.50    Years: 15.00    Pack years: 22.50    Types: Cigarettes  . Smokeless tobacco: Current User  Vaping Use  . Vaping Use: Never used  Substance Use Topics  . Alcohol use: No  . Drug use: No    Home Medications Prior to Admission medications   Medication Sig Start Date End Date Taking? Authorizing Provider  ALPRAZolam Prudy Feeler) 0.25 MG tablet Take 0.25 mg by mouth as needed for anxiety.    [provider]  diphenhydrAMINE (BENADRYL) 25 mg capsule Take 1 capsule (25 mg total) by mouth every 6 (six) hours as needed for itching. 01/13/13   Ghimire, Werner Lean, MD  EPINEPHrine (EPIPEN) 0.3 mg/0.3 mL SOAJ Inject 0.3 mLs (0.3 mg total) into the muscle once. 01/13/13   Ghimire, Werner Lean, MD  methylphenidate (RITALIN) 10 MG tablet Take 10 mg by mouth as needed.    [provider]    Allergies    Codeine and Bee venom  Review of Systems   Review of Systems  Constitutional: Positive for appetite change. Negative for activity change, chills, fatigue and  fever.  HENT: Negative.   Eyes: Negative.   Respiratory: Negative.  Negative for chest tightness and shortness of breath.   Cardiovascular: Negative.  Negative for chest pain, palpitations and leg swelling.  Gastrointestinal: Positive for abdominal pain. Negative for blood in stool, constipation, diarrhea, nausea and vomiting.  Genitourinary: Negative.   Musculoskeletal: Negative.   Skin: Negative.   Neurological: Negative.   Hematological: Bruises/bleeds easily.  Psychiatric/Behavioral: Negative.     Physical Exam Updated Vital Signs BP (!) 159/88 (BP Location: Right Arm)   Pulse 60   Temp 98 F (36.7 C) (Oral)   Resp 18    SpO2 100%   Physical Exam Vitals and nursing note reviewed.  HENT:     Head: Normocephalic and atraumatic.     Nose: Nose normal.     Mouth/Throat:     Mouth: Mucous membranes are moist.     Pharynx: No oropharyngeal exudate or posterior oropharyngeal erythema.  Eyes:     General:        Right eye: No discharge.        Left eye: No discharge.     Conjunctiva/sclera: Conjunctivae normal.  Neck:     Trachea: Trachea and phonation normal.  Cardiovascular:     Rate and Rhythm: Normal rate and regular rhythm.     Pulses: Normal pulses.          Radial pulses are 2+ on the right side and 2+ on the left side.       Dorsalis pedis pulses are 2+ on the right side and 2+ on the left side.     Heart sounds: Normal heart sounds. No murmur heard.   Pulmonary:     Effort: Pulmonary effort is normal. No respiratory distress.     Breath sounds: Normal breath sounds. No wheezing or rales.  Chest:     Chest wall: No deformity, swelling, tenderness, crepitus or edema.  Abdominal:     General: Bowel sounds are normal. There is no distension.     Palpations: Abdomen is soft.     Tenderness: There is abdominal tenderness in the right upper quadrant and epigastric area. There is no right CVA tenderness, left CVA tenderness, guarding or rebound. Negative signs include Murphy's sign and Rovsing's sign.  Musculoskeletal:        General: No deformity.     Cervical back: Full passive range of motion without pain, normal range of motion and neck supple. No crepitus. No pain with movement, spinous process tenderness or muscular tenderness.     Right lower leg: No edema.     Left lower leg: No edema.  Lymphadenopathy:     Cervical: No cervical adenopathy.  Skin:    General: Skin is warm and dry.     Capillary Refill: Capillary refill takes less than 2 seconds.  Neurological:     General: No focal deficit present.     Mental Status: He is alert and oriented to person, place, and time. Mental status is  at baseline.     Cranial Nerves: Cranial nerves are intact.     Sensory: Sensation is intact.     Motor: Motor function is intact.     Gait: Gait is intact.  Psychiatric:        Mood and Affect: Mood normal.     ED Results / Procedures / Treatments   Labs (all labs ordered are listed, but only abnormal results are displayed) Labs Reviewed  LIPASE, BLOOD  COMPREHENSIVE METABOLIC PANEL  CBC    EKG None  Radiology CT Abdomen Pelvis W Contrast  Result Date: 07/20/2020 CLINICAL DATA:  Right lower quadrant abdominal pain EXAM: CT ABDOMEN AND PELVIS WITH CONTRAST TECHNIQUE: Multidetector CT imaging of the abdomen and pelvis was performed using the standard protocol following bolus administration of intravenous contrast. CONTRAST:  OMNIPAQUE IOHEXOL 300 MG/ML  SOLN COMPARISON:  None. FINDINGS: LOWER CHEST: Normal. HEPATOBILIARY: Normal hepatic contours. No intra- or extrahepatic biliary dilatation. The gallbladder is normal. PANCREAS: Normal pancreas. No ductal dilatation or peripancreatic fluid collection. SPLEEN: Normal. ADRENALS/URINARY TRACT: The adrenal glands are normal. No hydronephrosis, nephroureterolithiasis or solid renal mass. The urinary bladder is normal for degree of distention STOMACH/BOWEL: There is no hiatal hernia. Normal duodenal course and caliber. No small bowel dilatation or inflammation. No focal colonic abnormality. Normal appendix. VASCULAR/LYMPHATIC: There is calcific atherosclerosis of the abdominal aorta. No lymphadenopathy. REPRODUCTIVE: Normal prostate size with symmetric seminal vesicles. MUSCULOSKELETAL. No bony spinal canal stenosis or focal osseous abnormality. OTHER: None. IMPRESSION: 1. No acute abnormality of the abdomen or pelvis. Normal appendix. Aortic Atherosclerosis (ICD10-I70.0). Electronically Signed   By: Deatra Robinson M.D.   On: 07/20/2020 21:07    Procedures Procedures   Medications Ordered in ED Medications  ketorolac (TORADOL) 30 MG/ML  injection 30 mg (30 mg Intravenous Given 07/20/20 2011)  iohexol (OMNIPAQUE) 300 MG/ML solution 100 mL (100 mLs Intravenous Contrast Given 07/20/20 2045)    ED Course  I have reviewed the triage vital signs and the nursing notes.  Pertinent labs & imaging results that were available during my care of the patient were reviewed by me and considered in my medical decision making (see chart for details).    MDM Rules/Calculators/A&P                         55 year old male who presents with an acute exacerbation of his well-known chronic right-sided abdominal pain.  Differential diagnosis of epigastric pain includes but is not limited to: Functional or nonulcer dyspepsia , PUD, GERD, Gastritis, (NSAIDs, alcohol, stress, H. pylori, pernicious anemia), pancreatitis / pancreatic cancer, overeating indigestion (high-fat foods, coffee), drugs (aspirin, antibiotics (eg, macrolides, metronidazole), corticosteroids, digoxin, narcotics, theophylline), gastroparesis, gastric volvulus, gastric cancer, lactose intolerance, malabsorption, parasitic infection (Giardia, Strongyloides, Ascaris), abdominal hernia, intestinal ischemia, esophageal rupture,  cholelithiasis /choledocholithiasis / cholangitis, hepatitis, ACS, pericarditis, pneumonia.  Vital signs are normal on intake.  Patient is well-appearing.  Cardiopulmonary exam is normal, abdominal exam is significant for some very mild right upper quadrant tenderness to palpation without rebound or guarding.  Basic laboratory studies were obtained in triage.  CBC without leukocytosis or anemia, CMP unremarkable, lipase normal.  Patient has had multiple normal right upper quadrant ultrasounds.  Will proceed with CT of abdomen pelvis at this time.  Analgesia offered.  CT of the abdomen pelvis negative for acute abnormality. Pain improved after administration of Toradol.   Given reassuring physical exam, signs, laboratory studies and imaging studies, no further work-up is  warranted in the ED at this time.  Tammy endorsed understanding of his medical evaluation and treatment plan.  Each of his questions was answered to his expressed satisfaction.  Return precautions were given.  Patient is well-appearing, stable, and appropriate for discharge at this time.  This chart was dictated using voice recognition software, Dragon. Despite the best efforts of this provider to proofread and correct errors, errors may still occur which can change documentation meaning.  Final Clinical Impression(s) / ED  Diagnoses Final diagnoses:  Right upper quadrant abdominal pain    Rx / DC Orders ED Discharge Orders    None       Sherrilee Gilles 07/20/20 2239    Virgina Norfolk, DO 07/20/20 2240

## 2020-07-20 NOTE — Discharge Instructions (Addendum)
You were evaluated in the emergency department today for your abdominal pain.  Your blood work, vital signs, physical exam, and CT scan were all very reassuring.  There does not appear to be any signs of infection, there are no abnormalities on your CT scan to explain your intermittent abdominal pain.  I suspect there may be a component of bowel gas contributing to your abdominal pain.  Please keep your appointment as scheduled with [facility] for 07/27/2020.   Return to the emergency department develop any worsening abdominal pain, nausea or vomiting that does not stop, difficulty breathing, chest pain, or any other new severe symptoms.

## 2020-07-27 ENCOUNTER — Encounter: Payer: Self-pay | Admitting: Gastroenterology

## 2020-07-27 ENCOUNTER — Ambulatory Visit (INDEPENDENT_AMBULATORY_CARE_PROVIDER_SITE_OTHER): Payer: BC Managed Care – PPO | Admitting: Gastroenterology

## 2020-07-27 VITALS — BP 118/80 | HR 65 | Ht 70.0 in | Wt 233.0 lb

## 2020-07-27 DIAGNOSIS — R1011 Right upper quadrant pain: Secondary | ICD-10-CM | POA: Diagnosis not present

## 2020-07-27 DIAGNOSIS — Z7902 Long term (current) use of antithrombotics/antiplatelets: Secondary | ICD-10-CM | POA: Diagnosis not present

## 2020-07-27 NOTE — Progress Notes (Signed)
07/27/2020 Darin White [id number] [birth year]   HISTORY OF PRESENT ILLNESS: This is a 55 year old male is a patient of Dr. Orvan Falconer.  He was seen by her once in September 2020 for evaluation of right upper quadrant abdominal pain and also to have colonoscopy.  He has never had colonoscopy in the past.  He was scheduled for EGD and colonoscopy with Dr. Orvan Falconer back in the fall 2020, but never had those performed.  He was also scheduled for HIDA scan with CCK to evaluate his right upper quadrant abdominal pain, but never had that performed either.  He is here today again with complaints of right upper quadrant abdominal pain.  He said that this has been present for 8 or 10 years.  He constantly has a pinpoint area in his right upper quadrant that is a dull discomfort.  He says that he cut out all fried foods for a couple of years, but a couple of weeks ago he was craving fried chicken.  After eating fried chicken he had severe right upper quadrant abdominal pain and also diarrhea.  He denies heartburn and reflux type symptoms.  He says that his stools tend to be soft to formed and he goes twice a day every day.  He denies seeing blood in his stools.  He had used NSAIDs in the past, but has not used any since he has been on the Effient after having 2 drug-eluting stents placed in his heart in June 2021.  He has had a few ultrasounds over the years and had a CT scan of the abdomen and pelvis with contrast just last week that have not shown any definite cause of his symptoms.  He has been noted to have gallbladder polyps, which appear to be stable over the last few years.  CBC, CMP, lipase all unremarkable.   Past Medical History:  Diagnosis Date  . Anxiety   . Bee sting-induced anaphylaxis   . CAD (coronary artery disease)   . [facility] spotted fever   . Traumatic brain injury (HCC)   . Vasculitis [facility])    Past Surgical History:  Procedure Laterality Date  . arm surgery     left arm /  tendon   . STENT REMOVAL      reports that he has been smoking cigarettes. He has a 22.50 pack-year smoking history. He has never used smokeless tobacco. He reports current alcohol use. He reports that he does not use drugs. family history includes Hypertension in his mother; Lung cancer in his father and mother; Stroke in his mother. Allergies  Allergen Reactions  . Codeine Nausea And Vomiting, Nausea Only and Other (See Comments)    Stomach issues   . Bee Venom Swelling    Other reaction(s): VOMITING  Yellow jackets        Outpatient Encounter Medications as of 07/27/2020  Medication Sig  . ALPRAZolam (XANAX) 0.25 MG tablet Take 0.25 mg by mouth as needed for anxiety.  Marland Kitchen aspirin 81 MG EC tablet Take 81 mg by mouth daily.  Marland Kitchen atomoxetine (STRATTERA) 40 MG capsule 1 capsule in the morning  . atorvastatin (LIPITOR) 80 MG tablet Take 80 mg by mouth daily.  . diphenhydrAMINE (BENADRYL) 25 mg capsule Take 1 capsule (25 mg total) by mouth every 6 (six) hours as needed for itching.  Marland Kitchen EPINEPHrine (EPIPEN) 0.3 mg/0.3 mL SOAJ Inject 0.3 mLs (0.3 mg total) into the muscle once.  . methylphenidate (RITALIN) 10 MG tablet Take 10 mg  by mouth as needed.  . metoprolol succinate (TOPROL-XL) 25 MG 24 hr tablet Take 12.5 mg by mouth daily.  . nitroGLYCERIN (NITROSTAT) 0.4 MG SL tablet Place under the tongue.  . prasugrel (EFFIENT) 10 MG TABS tablet See admin instructions.  . pregabalin (LYRICA) 75 MG capsule Take 1 capsule by mouth at bedtime.  . Vitamin D, Ergocalciferol, (DRISDOL) 1.25 MG (50000 UNIT) CAPS capsule 1 capsule   No facility-administered encounter medications on file as of 07/27/2020.     REVIEW OF SYSTEMS  : All other systems reviewed and negative except where noted in the History of Present Illness.   PHYSICAL EXAM: BP 118/80   Pulse 65   Ht 5\' 10"  (1.778 m)   Wt 233 lb (105.7 kg)   BMI 33.43 kg/m  General: Well developed white male in no acute distress Head:  Normocephalic and atraumatic Eyes:  Sclerae anicteric, conjunctiva pink. Ears: Normal auditory acuity Lungs: Clear throughout to auscultation; no W/R/R. Heart: Regular rate and rhythm; no M/R/G. Abdomen: Soft, non-distended.  BS present.  Minimal RUQ TTP. Musculoskeletal: Symmetrical with no gross deformities  Skin: No lesions on visible extremities Extremities: No edema  Neurological: Alert oriented x 4, grossly non-focal Psychological:  Alert and cooperative. Normal mood and affect  ASSESSMENT AND PLAN: *RUQ abdominal pain: Always has a very very low-grade discomfort in one pinpoint spot, but was severe after eating fried food.  He has gallbladder polyps, but no definite stones on most recent ultrasound.  He was supposed to have a HIDA scan previously never had that performed.  We will plan for HIDA scan with CCK.  May need EGD to rule out UGI source as well once safe to hold Effient. *Screening colonoscopy:  Was supposed to have colonoscopy in 02/2019 and never proceeded.  Now had DES x 2 in 11/2019 and is on antiplatelets with Effient.  Will have him come back in June to discuss colonoscopy once a year has passed and it will be safer to take him off of  *CAD with DES x 2 placed in 11/2019, on Effient   CC:  No ref. provider found

## 2020-07-27 NOTE — Progress Notes (Signed)
Reviewed and agree with management plans. ? ?Deem Marmol L. Yerania Chamorro, MD, MPH  ?

## 2020-07-27 NOTE — Patient Instructions (Addendum)
If you are age 56 or older, your body mass index should be between 23-30. Your Body mass index is 33.43 kg/m. If this is out of the aforementioned range listed, please consider follow up with your Primary Care Provider.  If you are age 20 or younger, your body mass index should be between 19-25. Your Body mass index is 33.43 kg/m. If this is out of the aformentioned range listed, please consider follow up with your Primary Care Provider.   You have been scheduled for a HIDA scan at [facility] (1st floor) on 08-18-2020. Please arrive 30 minutes prior to your scheduled appointment at  12:87OM. Make certain not to have anything to eat or drink at least 6 hours prior to your test. Should this appointment date or time not work well for you, please call radiology scheduling at [phone].  _____________________________________________________________________ hepatobiliary (HIDA) scan is an imaging procedure used to diagnose problems in the liver, gallbladder and bile ducts. In the HIDA scan, a radioactive chemical or tracer is injected into a vein in your arm. The tracer is handled by the liver like bile. Bile is a fluid produced and excreted by your liver that helps your digestive system break down fats in the foods you eat. Bile is stored in your gallbladder and the gallbladder releases the bile when you eat a meal. A special nuclear medicine scanner (gamma camera) tracks the flow of the tracer from your liver into your gallbladder and small intestine.  During your HIDA scan  You'll be asked to change into a hospital gown before your HIDA scan begins. Your health care team will position you on a table, usually on your back. The radioactive tracer is then injected into a vein in your arm.The tracer travels through your bloodstream to your liver, where it's taken up by the bile-producing cells. The radioactive tracer travels with the bile from your liver into your gallbladder and through your bile  ducts to your small intestine.You may feel some pressure while the radioactive tracer is injected into your vein. As you lie on the table, a special gamma camera is positioned over your abdomen taking pictures of the tracer as it moves through your body. The gamma camera takes pictures continually for about an hour. You'll need to keep still during the HIDA scan. This can become uncomfortable, but you may find that you can lessen the discomfort by taking deep breaths and thinking about other things. Tell your health care team if you're uncomfortable. The radiologist will watch on a computer the progress of the radioactive tracer through your body. The HIDA scan may be stopped when the radioactive tracer is seen in the gallbladder and enters your small intestine. This typically takes about an hour. In some cases extra imaging will be performed if original images aren't satisfactory, if morphine is given to help visualize the gallbladder or if the medication CCK is given to look at the contraction of the gallbladder. This test typically takes 2 hours to complete. ________________________________________________________________________  Due to recent changes in healthcare laws, you may see the results of your imaging and laboratory studies on MyChart before your provider has had a chance to review them.  We understand that in some cases there may be results that are confusing or concerning to you. Not all laboratory results come back in the same time frame and the provider may be waiting for multiple results in order to interpret others.  Please give [place] 48 hours in order for your  provider to thoroughly review all the results before contacting the office for clarification of your results.   It was a pleasure to see you today!  Doug Sou, PA

## 2020-08-18 ENCOUNTER — Other Ambulatory Visit: Payer: Self-pay

## 2020-08-18 ENCOUNTER — Ambulatory Visit (HOSPITAL_COMMUNITY): Payer: BC Managed Care – PPO

## 2020-08-18 ENCOUNTER — Ambulatory Visit (HOSPITAL_COMMUNITY)
Admission: RE | Admit: 2020-08-18 | Discharge: 2020-08-18 | Disposition: A | Discharge status: Home / Self Care | Payer: BC Managed Care – PPO | Source: Ambulatory Visit | Attending: Gastroenterology | Admitting: Gastroenterology

## 2020-08-18 ENCOUNTER — Encounter (HOSPITAL_COMMUNITY): Admission: RE | Admit: 2020-08-18 | Payer: BC Managed Care – PPO | Source: Ambulatory Visit

## 2020-08-18 DIAGNOSIS — R1011 Right upper quadrant pain: Secondary | ICD-10-CM | POA: Diagnosis present

## 2020-08-18 MED ORDER — TECHNETIUM TC 99M MEBROFENIN IV KIT
5.4000 | PACK | Freq: Once | INTRAVENOUS | Status: AC | PRN
  Administered 2020-08-18: 5.4 via INTRAVENOUS

## 2023-02-11 ENCOUNTER — Other Ambulatory Visit: Payer: Self-pay

## 2023-02-11 ENCOUNTER — Ambulatory Visit: Payer: Commercial Managed Care - HMO | Attending: Orthopedic Surgery | Admitting: Physical Therapy

## 2023-02-11 ENCOUNTER — Encounter: Payer: Self-pay | Admitting: Physical Therapy

## 2023-02-11 DIAGNOSIS — R29898 Other symptoms and signs involving the musculoskeletal system: Secondary | ICD-10-CM | POA: Diagnosis present

## 2023-02-11 DIAGNOSIS — M542 Cervicalgia: Secondary | ICD-10-CM | POA: Insufficient documentation

## 2023-02-11 DIAGNOSIS — R293 Abnormal posture: Secondary | ICD-10-CM | POA: Insufficient documentation

## 2023-02-11 NOTE — Therapy (Signed)
OUTPATIENT PHYSICAL THERAPY CERVICAL EVALUATION   Patient Name: Darin White MRN: [id number] DOB:August 31, 1965, 57 y.o., male Today's Date: 02/11/2023  END OF SESSION:  PT End of Session - 02/11/23 0853     Visit Number 1    Number of Visits 13    Date for PT Re-Evaluation 03/25/23    Authorization Type [employer] and M[employer]    Authorization Time Period 02/11/23 to 03/25/23    PT Start Time 0851   pt late   PT Stop Time 0929    PT Time Calculation (min) 38 min    Activity Tolerance Patient tolerated treatment well    Behavior During Therapy [facility] for tasks assessed/performed             Past Medical History:  Diagnosis Date   Anxiety    Bee sting-induced anaphylaxis    CAD (coronary artery disease)    [place] spotted fever    Traumatic brain injury (HCC)    Vasculitis (HCC)    Past Surgical History:  Procedure Laterality Date   arm surgery     left arm / tendon    STENT REMOVAL     Patient Active Problem List   Diagnosis Date Noted   RUQ pain 07/27/2020   Antiplatelet or antithrombotic long-term use 07/27/2020   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 01/12/2013   LOC (loss of consciousness) (HCC) 01/12/2013   Hyperglycemia 01/12/2013   Fall 01/12/2013   Abrasion of face 01/12/2013   HYPERLIPIDEMIA 09/12/2008   SHOULDER PAIN, RIGHT 09/12/2008   PLANTAR FASCIITIS, LEFT 09/12/2008   OTHER ANXIETY STATES 08/25/2008   TOBACCO ABUSE 08/25/2008   PALPITATIONS 08/25/2008   DYSPNEA ON EXERTION 08/25/2008   NEPHROLITHIASIS, HX OF 08/25/2008    PCP: Angelina Pih MD   REFERRING PROVIDER: Beverely Low, MD  REFERRING DIAG: M54.2 (ICD-10-CM) - Cervicalgia  THERAPY DIAG:  Cervicalgia  Abnormal posture  Other symptoms and signs involving the musculoskeletal system  Rationale for Evaluation and Treatment: Rehabilitation  ONSET DATE: 2.5 months ago  SUBJECTIVE:                                                                                                                                                                                                          SUBJECTIVE STATEMENT: Dr seems to think I've got a bulged disc. My PCP thought I had a tear in my shoulder because of symptoms in my arm but then I noticed that if I hold my neck to the right the symptoms go away. Ortho did xrays and agreed that  its my neck and thinks I have a bulged disc. I have a lot of problems from cocking my head to the right, I hurt if I straighten back up and I can't sleep well unless I'm in a specific spot, couldn't sleep last night until about 330am. Muscle relaxor helped but I didn't take it Friday or Saturday bc my BP was high and then I ended up going to the ED due to BP. This started when I was pulling at lawnmower over and over about 2.5 months ago. I ripped my left bicep loose in the past, ripped the right bicep 1.5 months ago but its non-surgical, MD said take it easy on R arm.   Hand dominance: Right  PERTINENT HISTORY:  Anxiety, CAD, [place] fever, TBI, vasculitis, arm surgery  PAIN:  Are you having pain? Yes: NPRS scale: "like a toothache" /10 Pain location: L UE down arm  Pain description: tingling Aggravating factors: sitting up straight  Relieving factors: cocking head to right   PRECAUTIONS: Other: active tear R bicep, careful with loading too much, watch BP    RED FLAGS: None     WEIGHT BEARING RESTRICTIONS: No  FALLS:  Has patient fallen in last 6 months? 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  PLOF: Independent, Independent with basic ADLs, Independent with gait, and Independent with transfers  PATIENT GOALS: be able keep going with least amount of pain as possible   NEXT MD VISIT: Referring PRN  OBJECTIVE:     PATIENT SURVEYS:  FOTO 2nd session   COGNITION: Overall cognitive status: Within functional limits for  tasks assessed  SENSATION: Reports some tingling/numbness L UE with certain head positions  POSTURE: rounded shoulders, forward head, and increased thoracic kyphosis  PALPATION: TTP around paraspinals high thoracic region   CERVICAL ROM:   Active ROM A/PROM (deg) eval  Flexion 20* p!  Extension 12* p!  Right lateral flexion 26*  Left lateral flexion 21* p!  Right rotation 75% limited p!  Left rotation 25% limited   (Blank rows = not tested)  UPPER EXTREMITY ROM:  Active ROM Right eval Left eval  Shoulder flexion [facility]  Shoulder extension    Shoulder abduction [facility]  Shoulder adduction    Shoulder extension    Shoulder internal rotation T12 T10  Shoulder external rotation C7 C7  Elbow flexion    Elbow extension    Wrist flexion    Wrist extension    Wrist ulnar deviation    Wrist radial deviation    Wrist pronation    Wrist supination     (Blank rows = not tested)    TODAY'S TREATMENT:  DATE:   Eval   Objective measures, POC, HEP   Discussed/demonstrated and practiced all exercises in HEP  PATIENT EDUCATION:  Education details: exam findings, POC, HEP  Person educated: Patient Education method: Explanation, Demonstration, and Handouts Education comprehension: verbalized understanding, returned demonstration, and needs further education  HOME EXERCISE PROGRAM: Access Code: [email or web address] URL: [email or web address] Date: 02/11/2023 Prepared by: Nedra Hai  Exercises - Seated Cervical Retraction  - 2 x daily - 7 x weekly - 1 sets - 10 reps - 3 seconds hold - Seated Thoracic Lumbar Extension with Pectoralis Stretch  - 2 x daily - 7 x weekly - 1 sets - 10 reps - 3 seconds  hold - Seated Scapular Retraction with External Rotation  - 2 x daily - 7 x weekly - 1 sets - 10 reps - 2 seconds  hold - Seated Thoracic Flexion  and Rotation with Arms Crossed  - 2 x daily - 7 x weekly - 1 sets - 10 reps - 2 seconds  hold  ASSESSMENT:  CLINICAL IMPRESSION: Patient is a 57 y.o. M who was seen today for physical therapy evaluation and treatment for cervicalgia. Exam with objective findings as above. Will benefit from skilled PT services to address all concerns and reduce pain moving forward.    OBJECTIVE IMPAIRMENTS: decreased ROM, hypomobility, increased fascial restrictions, increased muscle spasms, impaired flexibility, impaired sensation, impaired UE functional use, improper body mechanics, and postural dysfunction.   ACTIVITY LIMITATIONS: carrying, lifting, reach over head, and caring for others  PARTICIPATION LIMITATIONS: driving, shopping, community activity, occupation, and yard work  PERSONAL FACTORS: Age, Behavior pattern, Education, Fitness, Past/current experiences, Profession, and Time since onset of injury/illness/exacerbation are also affecting patient's functional outcome.   REHAB POTENTIAL: Fair complex medical history, extensive hx of ortho injuries, chronicity of pain, physical job   CLINICAL DECISION MAKING: Stable/uncomplicated  EVALUATION COMPLEXITY: Low   GOALS: Goals reviewed with patient? No  SHORT TERM GOALS: Target date: 03/04/2023    Will be compliant with appropriate progressive HEP  Baseline:  Goal status: INITIAL  2.  Cervical ROM to be WNL without increased pain  Baseline:  Goal status: INITIAL  3.  Frequency and intensity of radicular sx in L UE to have improved by 50% Baseline:  Goal status: INITIAL    LONG TERM GOALS: Target date: 03/25/2023    MMT to be 5/5 globally with no increased pain or radicular sx  Baseline:  Goal status: INITIAL  2.  Will be able to maintain upright midline posture with no increased pain  Baseline:  Goal status: INITIAL  3.  Frequency and intensity of radicular sx to have improved by 75% Baseline:  Goal status: INITIAL  4.  Will  be able to sleep through the night without waking due to pain  Baseline:  Goal status: INITIAL  5.  Will be able to perform all functional work based tasks at home and in the workplace without increase in pain  Baseline:  Goal status: INITIAL     PLAN:  PT FREQUENCY: 2x/week  PT DURATION: 6 weeks  PLANNED INTERVENTIONS: Therapeutic exercises, Therapeutic activity, Neuromuscular re-education, Balance training, Gait training, Patient/Family education, Self Care, Joint mobilization, Aquatic Therapy, Dry Needling, Electrical stimulation, Spinal mobilization, Cryotherapy, Moist heat, Taping, Traction, Ultrasound, Ionotophoresis 4mg /ml Dexamethasone, Manual therapy, and Re-evaluation  PLAN FOR NEXT SESSION: postural training and strengthening, cervical traction, manual as desired   Nedra Hai, PT, DPT 02/11/23 9:47 AM

## 2023-02-21 ENCOUNTER — Ambulatory Visit: Payer: Commercial Managed Care - HMO | Attending: Orthopedic Surgery | Admitting: Physical Therapy

## 2023-02-21 DIAGNOSIS — M542 Cervicalgia: Secondary | ICD-10-CM | POA: Diagnosis present

## 2023-02-21 DIAGNOSIS — R29898 Other symptoms and signs involving the musculoskeletal system: Secondary | ICD-10-CM | POA: Insufficient documentation

## 2023-02-21 DIAGNOSIS — R293 Abnormal posture: Secondary | ICD-10-CM | POA: Insufficient documentation

## 2023-02-21 NOTE — Therapy (Signed)
OUTPATIENT PHYSICAL THERAPY CERVICAL    Patient Name: Darin White MRN: [id number] DOB:1965/11/29, 57 y.o., male Today's Date: 02/21/2023  END OF SESSION:  PT End of Session - 02/21/23 0759     Visit Number 2    Number of Visits 13    Date for PT Re-Evaluation 03/25/23    Authorization Type [employer] and MCD    Authorization Time Period 02/11/23 to 03/25/23    PT Start Time 0800    PT Stop Time 0845    PT Time Calculation (min) 45 min             Past Medical History:  Diagnosis Date   Anxiety    Bee sting-induced anaphylaxis    CAD (coronary artery disease)    Rocky [place] spotted fever    Traumatic brain injury (HCC)    Vasculitis (HCC)    Past Surgical History:  Procedure Laterality Date   arm surgery     left arm / tendon    STENT REMOVAL     Patient Active Problem List   Diagnosis Date Noted   RUQ pain 07/27/2020   Antiplatelet or antithrombotic long-term use 07/27/2020   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 01/12/2013   LOC (loss of consciousness) (HCC) 01/12/2013   Hyperglycemia 01/12/2013   Fall 01/12/2013   Abrasion of face 01/12/2013   HYPERLIPIDEMIA 09/12/2008   SHOULDER PAIN, RIGHT 09/12/2008   PLANTAR FASCIITIS, LEFT 09/12/2008   OTHER ANXIETY STATES 08/25/2008   TOBACCO ABUSE 08/25/2008   PALPITATIONS 08/25/2008   DYSPNEA ON EXERTION 08/25/2008   NEPHROLITHIASIS, HX OF 08/25/2008    PCP: Angelina Pih MD   REFERRING PROVIDER: Beverely Low, MD  REFERRING DIAG: M54.2 (ICD-10-CM) - Cervicalgia  THERAPY DIAG:  Cervicalgia  Abnormal posture  Other symptoms and signs involving the musculoskeletal system  Rationale for Evaluation and Treatment: Rehabilitation  ONSET DATE: 2.5 months ago  SUBJECTIVE:                                                                                                                                                                                                         SUBJECTIVE STATEMENT: doing HEP some-  need to get in routine. First big work day yesterday and I feel it PERTINENT HISTORY:  Anxiety, CAD, rocky mountain spotted fever, TBI, vasculitis, arm surgery  PAIN:  Are you having pain? Yes: NPRS scale: "like a toothache" /10 Pain location: L UE down arm  Pain description: tingling Aggravating factors: sitting up straight  Relieving factors: cocking head to right   PRECAUTIONS: Other:  active tear R bicep, careful with loading too much, watch BP    RED FLAGS: None     WEIGHT BEARING RESTRICTIONS: No  FALLS:  Has patient fallen in last 6 months? 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  PLOF: Independent, Independent with basic ADLs, Independent with gait, and Independent with transfers  PATIENT GOALS: be able keep going with least amount of pain as possible   NEXT MD VISIT: Referring PRN  OBJECTIVE:     PATIENT SURVEYS:  FOTO 2nd session   COGNITION: Overall cognitive status: Within functional limits for tasks assessed  SENSATION: Reports some tingling/numbness L UE with certain head positions  POSTURE: rounded shoulders, forward head, and increased thoracic kyphosis  PALPATION: TTP around paraspinals high thoracic region   CERVICAL ROM:   Active ROM A/PROM (deg) eval  Flexion 20* p!  Extension 12* p!  Right lateral flexion 26*  Left lateral flexion 21* p!  Right rotation 75% limited p!  Left rotation 25% limited   (Blank rows = not tested)  UPPER EXTREMITY ROM:  Active ROM Right eval Left eval  Shoulder flexion [facility]  Shoulder extension    Shoulder abduction [facility]  Shoulder adduction    Shoulder extension    Shoulder internal rotation T12 T10  Shoulder external rotation C7 C7  Elbow flexion    Elbow extension    Wrist flexion    Wrist extension    Wrist ulnar deviation    Wrist radial deviation    Wrist pronation    Wrist  supination     (Blank rows = not tested)    TODAY'S TREATMENT:                                                                                                                              DATE:   02/21/23 Neural tension stretching LUE Mulligan cerv SNAGS and NAGS to increase cerv ROM DSTW with TP release left UT/rhomoid DN to left Cerv/UT by Amy Speaks PT  - twitch response  Mech cerv traction supine 15 min Reviewed HEP and stressed importance of compliance   Eval   Objective measures, POC, HEP   Discussed/demonstrated and practiced all exercises in HEP  PATIENT EDUCATION:  Education details: exam findings, POC, HEP  Person educated: Patient Education method: Explanation, Demonstration, and Handouts Education comprehension: verbalized understanding, returned demonstration, and needs further education  HOME EXERCISE PROGRAM: Access Code: [email or web address] URL: [email or web address] Date: 02/11/2023 Prepared by: Nedra Hai  Exercises - Seated Cervical Retraction  - 2 x daily - 7 x weekly - 1 sets - 10 reps - 3 seconds hold - Seated Thoracic Lumbar Extension with Pectoralis Stretch  - 2 x daily - 7 x weekly - 1 sets - 10 reps - 3 seconds  hold - Seated Scapular Retraction with External Rotation  - 2 x daily -  7 x weekly - 1 sets - 10 reps - 2 seconds  hold - Seated Thoracic Flexion and Rotation with Arms Crossed  - 2 x daily - 7 x weekly - 1 sets - 10 reps - 2 seconds  hold  ASSESSMENT:  CLINICAL IMPRESSION:  pt arrives with increased pain and minimal HEP compliance. Radiating pain down left UE, positive neural tension on Left. Tightness throughout cerv with decreased ROM. IF pt RT SB and flex symptoms relief on LUE but increases RT cerv pain.  OBJECTIVE IMPAIRMENTS: decreased ROM, hypomobility, increased fascial restrictions, increased muscle spasms, impaired flexibility, impaired sensation, impaired UE functional use, improper body mechanics, and postural dysfunction.    ACTIVITY LIMITATIONS: carrying, lifting, reach over head, and caring for others  PARTICIPATION LIMITATIONS: driving, shopping, community activity, occupation, and yard work  PERSONAL FACTORS: Age, Behavior pattern, Education, Fitness, Past/current experiences, Profession, and Time since onset of injury/illness/exacerbation are also affecting patient's functional outcome.   REHAB POTENTIAL: Fair complex medical history, extensive hx of ortho injuries, chronicity of pain, physical job   CLINICAL DECISION MAKING: Stable/uncomplicated  EVALUATION COMPLEXITY: Low   GOALS: Goals reviewed with patient? No  SHORT TERM GOALS: Target date: 03/04/2023    Will be compliant with appropriate progressive HEP  Baseline:  Goal status: INITIAL  2.  Cervical ROM to be WNL without increased pain  Baseline:  Goal status: INITIAL  3.  Frequency and intensity of radicular sx in L UE to have improved by 50% Baseline:  Goal status: INITIAL    LONG TERM GOALS: Target date: 03/25/2023    MMT to be 5/5 globally with no increased pain or radicular sx  Baseline:  Goal status: INITIAL  2.  Will be able to maintain upright midline posture with no increased pain  Baseline:  Goal status: INITIAL  3.  Frequency and intensity of radicular sx to have improved by 75% Baseline:  Goal status: INITIAL  4.  Will be able to sleep through the night without waking due to pain  Baseline:  Goal status: INITIAL  5.  Will be able to perform all functional work based tasks at home and in the workplace without increase in pain  Baseline:  Goal status: INITIAL     PLAN:  PT FREQUENCY: 2x/week  PT DURATION: 6 weeks  PLANNED INTERVENTIONS: Therapeutic exercises, Therapeutic activity, Neuromuscular re-education, Balance training, Gait training, Patient/Family education, Self Care, Joint mobilization, Aquatic Therapy, Dry Needling, Electrical stimulation, Spinal mobilization, Cryotherapy, Moist heat,  Taping, Traction, Ultrasound, Ionotophoresis 4mg /ml Dexamethasone, Manual therapy, and Re-evaluation  PLAN FOR NEXT SESSION: postural training and strengthening, cervical traction, manual . Assess DN and mech traction    Patient Details  Name: Hadrian Legates MRN: [id number] Date of Birth: [birth year] Referring Provider:  Beverely Low, MD  Encounter Date: 02/21/2023   Suanne Marker, PTA 02/21/2023, 8:11 AM  [facility] [address] [place], [place], [address] Phone: [phone]   Fax:  [phone]

## 2023-02-25 ENCOUNTER — Ambulatory Visit: Payer: Commercial Managed Care - HMO | Admitting: Physical Therapy

## 2023-02-25 ENCOUNTER — Encounter: Payer: Self-pay | Admitting: Physical Therapy

## 2023-02-25 DIAGNOSIS — R293 Abnormal posture: Secondary | ICD-10-CM

## 2023-02-25 DIAGNOSIS — R29898 Other symptoms and signs involving the musculoskeletal system: Secondary | ICD-10-CM

## 2023-02-25 DIAGNOSIS — M542 Cervicalgia: Secondary | ICD-10-CM

## 2023-02-25 NOTE — Therapy (Signed)
OUTPATIENT PHYSICAL THERAPY CERVICAL TREATMENT   Patient Name: Darin White MRN: [id number] DOB:24-Jul-1965, 57 y.o., male Today's Date: 02/25/2023  END OF SESSION:  PT End of Session - 02/25/23 0805     Visit Number 3    Number of Visits 13    Date for PT Re-Evaluation 03/25/23    Authorization Type [employer] and M[employer]    Authorization Time Period 02/11/23 to 03/25/23    PT Start Time 0801    PT Stop Time 0840    PT Time Calculation (min) 39 min    Activity Tolerance Patient tolerated treatment well    Behavior During Therapy [facility] for tasks assessed/performed              Past Medical History:  Diagnosis Date   Anxiety    Bee sting-induced anaphylaxis    CAD (coronary artery disease)    Rocky [place] spotted fever    Traumatic brain injury (HCC)    Vasculitis (HCC)    Past Surgical History:  Procedure Laterality Date   arm surgery     left arm / tendon    STENT REMOVAL     Patient Active Problem List   Diagnosis Date Noted   RUQ pain 07/27/2020   Antiplatelet or antithrombotic long-term use 07/27/2020   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 01/12/2013   LOC (loss of consciousness) (HCC) 01/12/2013   Hyperglycemia 01/12/2013   Fall 01/12/2013   Abrasion of face 01/12/2013   HYPERLIPIDEMIA 09/12/2008   SHOULDER PAIN, RIGHT 09/12/2008   PLANTAR FASCIITIS, LEFT 09/12/2008   OTHER ANXIETY STATES 08/25/2008   TOBACCO ABUSE 08/25/2008   PALPITATIONS 08/25/2008   DYSPNEA ON EXERTION 08/25/2008   NEPHROLITHIASIS, HX OF 08/25/2008    PCP: Angelina Pih MD   REFERRING PROVIDER: Beverely Low, MD  REFERRING DIAG: M54.2 (ICD-10-CM) - Cervicalgia  THERAPY DIAG:  Cervicalgia  Abnormal posture  Other symptoms and signs involving the musculoskeletal system  Rationale for Evaluation and Treatment: Rehabilitation  ONSET DATE: 2.5 months ago  SUBJECTIVE:                                                                                                                                                                                                          SUBJECTIVE STATEMENT:   I was really hurting over the weekend, I did a lot with my mower and weed eater and that flared it up. Felt OK after last visit, DN was something new. L UE is tingling now.  Not sure how much nerve damage I've got after the operation  in LUE. HEP is "pretty good" if I can remember to do them, doing exercise every day now. My doctor and I want me get weight off.   PERTINENT HISTORY:  Anxiety, CAD, rocky mountain spotted fever, TBI, vasculitis, arm surgery  PAIN:  Are you having pain? Yes: NPRS scale: 2/10 Pain location: L UE down arm  Pain description: tingling Aggravating factors: sitting up straight  Relieving factors: cocking head to right   PRECAUTIONS: Other: active tear R bicep, careful with loading too much, watch BP    RED FLAGS: None     WEIGHT BEARING RESTRICTIONS: No  FALLS:  Has patient fallen in last 6 months? 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  PLOF: Independent, Independent with basic ADLs, Independent with gait, and Independent with transfers  PATIENT GOALS: be able keep going with least amount of pain as possible   NEXT MD VISIT: Referring PRN  OBJECTIVE:     PATIENT SURVEYS:  FOTO 2nd session   COGNITION: Overall cognitive status: Within functional limits for tasks assessed  SENSATION: Reports some tingling/numbness L UE with certain head positions  POSTURE: rounded shoulders, forward head, and increased thoracic kyphosis  PALPATION: TTP around paraspinals high thoracic region   CERVICAL ROM:   Active ROM A/PROM (deg) eval  Flexion 20* p!  Extension 12* p!  Right lateral flexion 26*  Left lateral flexion 21* p!  Right rotation 75% limited p!  Left rotation 25% limited   (Blank rows = not tested)  UPPER EXTREMITY  ROM:  Active ROM Right eval Left eval  Shoulder flexion [facility]  Shoulder extension    Shoulder abduction [facility]  Shoulder adduction    Shoulder extension    Shoulder internal rotation T12 T10  Shoulder external rotation C7 C7  Elbow flexion    Elbow extension    Wrist flexion    Wrist extension    Wrist ulnar deviation    Wrist radial deviation    Wrist pronation    Wrist supination     (Blank rows = not tested)    TODAY'S TREATMENT:                                                                                                                              DATE:   02/25/23  Attempted BP check due to recent hx of severe HTN, repeated error messages on machine, monitored sx closely and adjusted interventions PRN   TherEx  UBE L5 x6 minutes backwards, dropped to L4 due to increased RUE pain  3 way reaches with green TB on wall x10 B Chin tuck + rotation x10 B cues to not force movement through pain Chin tuck + lateral flexion x10 B cues to not force movement through pain        02/21/23 Neural tension stretching LUE Mulligan cerv SNAGS and  NAGS to increase cerv ROM DSTW with TP release left UT/rhomoid DN to left Cerv/UT by Amy Speaks PT  - twitch response  Mech cerv traction supine 15 min Reviewed HEP and stressed importance of compliance   Eval   Objective measures, POC, HEP   Discussed/demonstrated and practiced all exercises in HEP  PATIENT EDUCATION:  Education details: exam findings, POC, HEP  Person educated: Patient Education method: Explanation, Demonstration, and Handouts Education comprehension: verbalized understanding, returned demonstration, and needs further education  HOME EXERCISE PROGRAM: Access Code: [email or web address] URL: [email or web address] Date: 02/11/2023 Prepared by: Nedra Hai  Exercises - Seated Cervical Retraction  - 2 x daily - 7 x weekly - 1 sets - 10 reps - 3 seconds hold - Seated Thoracic Lumbar Extension  with Pectoralis Stretch  - 2 x daily - 7 x weekly - 1 sets - 10 reps - 3 seconds  hold - Seated Scapular Retraction with External Rotation  - 2 x daily - 7 x weekly - 1 sets - 10 reps - 2 seconds  hold - Seated Thoracic Flexion and Rotation with Arms Crossed  - 2 x daily - 7 x weekly - 1 sets - 10 reps - 2 seconds  hold  ASSESSMENT:  CLINICAL IMPRESSION:    Rasmus arrived today with more pain after doing a lot of yard work again this weekend, really had to Interior and spatial designer again (which led to his injury on the left side initially). Focused on postural training today, also incorporated manual techniques as time allowed today. Pain on R side of neck now worse than L, still with radicular sx L UE. Continued to encourage HEP compliance. Monitored closely for sx of HTN, unable to get accurate reading via BP machine today. Encouraged less reps with increased resistance for exercises at home- he is currently doing 50 reps of exercises with no resistance on his own. Very pleasant and cooperative but very talkative, interventions limited today.   Will continue efforts.   OBJECTIVE IMPAIRMENTS: decreased ROM, hypomobility, increased fascial restrictions, increased muscle spasms, impaired flexibility, impaired sensation, impaired UE functional use, improper body mechanics, and postural dysfunction.   ACTIVITY LIMITATIONS: carrying, lifting, reach over head, and caring for others  PARTICIPATION LIMITATIONS: driving, shopping, community activity, occupation, and yard work  PERSONAL FACTORS: Age, Behavior pattern, Education, Fitness, Past/current experiences, Profession, and Time since onset of injury/illness/exacerbation are also affecting patient's functional outcome.   REHAB POTENTIAL: Fair complex medical history, extensive hx of ortho injuries, chronicity of pain, physical job   CLINICAL DECISION MAKING: Stable/uncomplicated  EVALUATION COMPLEXITY: Low   GOALS: Goals reviewed with patient?  No  SHORT TERM GOALS: Target date: 03/04/2023    Will be compliant with appropriate progressive HEP  Baseline:  Goal status: INITIAL  2.  Cervical ROM to be WNL without increased pain  Baseline:  Goal status: INITIAL  3.  Frequency and intensity of radicular sx in L UE to have improved by 50% Baseline:  Goal status: INITIAL    LONG TERM GOALS: Target date: 03/25/2023    MMT to be 5/5 globally with no increased pain or radicular sx  Baseline:  Goal status: INITIAL  2.  Will be able to maintain upright midline posture with no increased pain  Baseline:  Goal status: INITIAL  3.  Frequency and intensity of radicular sx to have improved by 75% Baseline:  Goal status: INITIAL  4.  Will be able to sleep through the night without waking due to pain  Baseline:  Goal status: INITIAL  5.  Will be able to perform all functional work based tasks at home and in the workplace without increase in pain  Baseline:  Goal status: INITIAL     PLAN:  PT FREQUENCY: 2x/week  PT DURATION: 6 weeks  PLANNED INTERVENTIONS: Therapeutic exercises, Therapeutic activity, Neuromuscular re-education, Balance training, Gait training, Patient/Family education, Self Care, Joint mobilization, Aquatic Therapy, Dry Needling, Electrical stimulation, Spinal mobilization, Cryotherapy, Moist heat, Taping, Traction, Ultrasound, Ionotophoresis 4mg /ml Dexamethasone, Manual therapy, and Re-evaluation  PLAN FOR NEXT SESSION: postural training and strengthening, cervical traction, manual . DN Thursday   Nedra Hai, PT, DPT 02/25/23 8:45 AM     Patient Details  Name: Damian Provance MRN: [id number] Date of Birth: 01-04-1966 Referring Provider:  Beverely Low, MD  Encounter Date: 02/25/2023   Nedra Hai, PT, DPT 02/25/23 8:44 AM   [facility] [address] [place], [place], [address] Phone: [phone]   Fax:  [phone]

## 2023-02-27 ENCOUNTER — Ambulatory Visit: Payer: Commercial Managed Care - HMO | Admitting: Physical Therapy

## 2023-03-04 ENCOUNTER — Ambulatory Visit: Payer: Commercial Managed Care - HMO | Admitting: Physical Therapy

## 2023-03-04 DIAGNOSIS — R29898 Other symptoms and signs involving the musculoskeletal system: Secondary | ICD-10-CM

## 2023-03-04 DIAGNOSIS — M542 Cervicalgia: Secondary | ICD-10-CM

## 2023-03-04 DIAGNOSIS — R293 Abnormal posture: Secondary | ICD-10-CM

## 2023-03-04 NOTE — Therapy (Signed)
OUTPATIENT PHYSICAL THERAPY CERVICAL TREATMENT   Patient Name: Darin White MRN: [id number] DOB:Oct 05, 1965, 57 y.o., male Today's Date: 03/04/2023  END OF SESSION:  PT End of Session - 03/04/23 0801     Visit Number 4    Date for PT Re-Evaluation 03/25/23    Authorization Type [employer] and MCD    Authorization Time Period 02/11/23 to 03/25/23    PT Start Time 0800    PT Stop Time 0845    PT Time Calculation (min) 45 min              Past Medical History:  Diagnosis Date   Anxiety    Bee sting-induced anaphylaxis    CAD (coronary artery disease)    Rocky Mountain spotted fever    Traumatic brain injury (HCC)    Vasculitis (HCC)    Past Surgical History:  Procedure Laterality Date   arm surgery     left arm / tendon    STENT REMOVAL     Patient Active Problem List   Diagnosis Date Noted   RUQ pain 07/27/2020   Antiplatelet or antithrombotic long-term use 07/27/2020   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 01/12/2013   LOC (loss of consciousness) (HCC) 01/12/2013   Hyperglycemia 01/12/2013   Fall 01/12/2013   Abrasion of face 01/12/2013   HYPERLIPIDEMIA 09/12/2008   SHOULDER PAIN, RIGHT 09/12/2008   PLANTAR FASCIITIS, LEFT 09/12/2008   OTHER ANXIETY STATES 08/25/2008   TOBACCO ABUSE 08/25/2008   PALPITATIONS 08/25/2008   DYSPNEA ON EXERTION 08/25/2008   NEPHROLITHIASIS, HX OF 08/25/2008    PCP: Angelina Pih MD   REFERRING PROVIDER: Beverely Low, MD  REFERRING DIAG: M54.2 (ICD-10-CM) - Cervicalgia  THERAPY DIAG:  Cervicalgia  Abnormal posture  Other symptoms and signs involving the musculoskeletal system  Rationale for Evaluation and Treatment: Rehabilitation  ONSET DATE: 2.5 months ago  SUBJECTIVE:                                                                                                                                                                                                         SUBJECTIVE STATEMENT:  About the same. DN did  provide relief. Aggravated with RTW yesterday PERTINENT HISTORY:  Anxiety, CAD, rocky mountain spotted fever, TBI, vasculitis, arm surgery  PAIN:  Are you having pain? Yes: NPRS scale: 2/10 Pain location: L UE down arm  Pain description: tingling Aggravating factors: sitting up straight  Relieving factors: cocking head to right   PRECAUTIONS: Other: active tear R bicep, careful with loading too much, watch BP  RED FLAGS: None     WEIGHT BEARING RESTRICTIONS: No  FALLS:  Has patient fallen in last 6 months? 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  PLOF: Independent, Independent with basic ADLs, Independent with gait, and Independent with transfers  PATIENT GOALS: be able keep going with least amount of pain as possible   NEXT MD VISIT: Referring PRN  OBJECTIVE:     PATIENT SURVEYS:  FOTO 2nd session   COGNITION: Overall cognitive status: Within functional limits for tasks assessed  SENSATION: Reports some tingling/numbness L UE with certain head positions  POSTURE: rounded shoulders, forward head, and increased thoracic kyphosis  PALPATION: TTP around paraspinals high thoracic region   CERVICAL ROM:   Active ROM A/PROM (deg) eval  Flexion 20* p!  Extension 12* p!  Right lateral flexion 26*  Left lateral flexion 21* p!  Right rotation 75% limited p!  Left rotation 25% limited   (Blank rows = not tested)  UPPER EXTREMITY ROM:  Active ROM Right eval Left eval  Shoulder flexion [facility]  Shoulder extension    Shoulder abduction [facility]  Shoulder adduction    Shoulder extension    Shoulder internal rotation T12 T10  Shoulder external rotation C7 C7  Elbow flexion    Elbow extension    Wrist flexion    Wrist extension    Wrist ulnar deviation    Wrist radial deviation    Wrist pronation    Wrist supination     (Blank rows = not  tested)    TODAY'S TREATMENT:                                                                                                                              DATE:   03/04/23 UBE 2 min fwd 2 min backward L 3 Cable pulley shld ext 10# 2 sets 10. 15# row 2 sets 10 Green tband ER 2 sets 10 Cerv retraction head on ball on wall 15x hold 3 sec Ball vs wall 5 x CW and CCW Lat pull down 20# 2 sets 10 Neural tension stretching LUE Mulligan cerv SNAGS and NAGS to increase cerv ROM DSTW with TP release left UT/rhomoid DN to above by MAlbright PT with twitch response    02/25/23  Attempted BP check due to recent hx of severe HTN, repeated error messages on machine, monitored sx closely and adjusted interventions PRN   TherEx  UBE L5 x6 minutes backwards, dropped to L4 due to increased RUE pain  3 way reaches with green TB on wall x10 B Chin tuck + rotation x10 B cues to not force movement through pain Chin tuck + lateral flexion x10 B cues to not force movement through pain        02/21/23 Neural tension stretching LUE Mulligan cerv SNAGS and NAGS to increase cerv ROM DSTW with  TP release left UT/rhomoid DN to left Cerv/UT by Amy Speaks PT  - twitch response  Mech cerv traction supine 15 min Reviewed HEP and stressed importance of compliance   Eval   Objective measures, POC, HEP   Discussed/demonstrated and practiced all exercises in HEP  PATIENT EDUCATION:  Education details: exam findings, POC, HEP  Person educated: Patient Education method: Explanation, Demonstration, and Handouts Education comprehension: verbalized understanding, returned demonstration, and needs further education  HOME EXERCISE PROGRAM: Access Code: [email or web address] URL: [email or web address] Date: 02/11/2023 Prepared by: Nedra Hai  Exercises - Seated Cervical Retraction  - 2 x daily - 7 x weekly - 1 sets - 10 reps - 3 seconds hold - Seated Thoracic Lumbar Extension with Pectoralis  Stretch  - 2 x daily - 7 x weekly - 1 sets - 10 reps - 3 seconds  hold - Seated Scapular Retraction with External Rotation  - 2 x daily - 7 x weekly - 1 sets - 10 reps - 2 seconds  hold - Seated Thoracic Flexion and Rotation with Arms Crossed  - 2 x daily - 7 x weekly - 1 sets - 10 reps - 2 seconds  hold  ASSESSMENT:  CLINICAL IMPRESSION:    Pt arrived feeling about the same but states DN did help. Progressed postural ex with cuing especially for head position. Positive NT stretching and tenderness with multi TP. Twitch response with DN OBJECTIVE IMPAIRMENTS: decreased ROM, hypomobility, increased fascial restrictions, increased muscle spasms, impaired flexibility, impaired sensation, impaired UE functional use, improper body mechanics, and postural dysfunction.   ACTIVITY LIMITATIONS: carrying, lifting, reach over head, and caring for others  PARTICIPATION LIMITATIONS: driving, shopping, community activity, occupation, and yard work  PERSONAL FACTORS: Age, Behavior pattern, Education, Fitness, Past/current experiences, Profession, and Time since onset of injury/illness/exacerbation are also affecting patient's functional outcome.   REHAB POTENTIAL: Fair complex medical history, extensive hx of ortho injuries, chronicity of pain, physical job   CLINICAL DECISION MAKING: Stable/uncomplicated  EVALUATION COMPLEXITY: Low   GOALS: Goals reviewed with patient? No  SHORT TERM GOALS: Target date: 03/04/2023    Will be compliant with appropriate progressive HEP  Baseline:  Goal status: 03/04/23 met  2.  Cervical ROM to be WNL without increased pain  Baseline:  Goal status: INITIAL  3.  Frequency and intensity of radicular sx in L UE to have improved by 50% Baseline:  Goal status: 03/04/23 progressing    LONG TERM GOALS: Target date: 03/25/2023    MMT to be 5/5 globally with no increased pain or radicular sx  Baseline:  Goal status: INITIAL  2.  Will be able to maintain upright  midline posture with no increased pain  Baseline:  Goal status: INITIAL  3.  Frequency and intensity of radicular sx to have improved by 75% Baseline:  Goal status: INITIAL  4.  Will be able to sleep through the night without waking due to pain  Baseline:  Goal status: INITIAL  5.  Will be able to perform all functional work based tasks at home and in the workplace without increase in pain  Baseline:  Goal status: INITIAL     PLAN:  PT FREQUENCY: 2x/week  PT DURATION: 6 weeks  PLANNED INTERVENTIONS: Therapeutic exercises, Therapeutic activity, Neuromuscular re-education, Balance training, Gait training, Patient/Family education, Self Care, Joint mobilization, Aquatic Therapy, Dry Needling, Electrical stimulation, Spinal mobilization, Cryotherapy, Moist heat, Taping, Traction, Ultrasound, Ionotophoresis 4mg /ml Dexamethasone, Manual therapy, and Re-evaluation  PLAN FOR NEXT SESSION: postural training and strengthening,  cervical traction, manual .       Patient Details  Name: Lemuel Kreiner MRN: [id number] Date of Birth: 1965/09/04 Referring Provider:  Beverely Low, MD  Encounter Date: 03/04/2023   Suanne Marker, PTA 03/04/2023, 8:03 AM  [place] [facility] [address] [place], [place], [address] Phone: [phone]   Fax:  [phone]

## 2023-03-06 ENCOUNTER — Ambulatory Visit: Payer: Commercial Managed Care - HMO | Admitting: Physical Therapy

## 2023-03-06 DIAGNOSIS — M542 Cervicalgia: Secondary | ICD-10-CM

## 2023-03-06 DIAGNOSIS — R293 Abnormal posture: Secondary | ICD-10-CM

## 2023-03-06 NOTE — Therapy (Signed)
OUTPATIENT PHYSICAL THERAPY CERVICAL TREATMENT   Patient Name: Darin White MRN: [id number] DOB:12-01-1965, 57 y.o., male Today's Date: 03/06/2023  END OF SESSION:  PT End of Session - 03/06/23 0804     Visit Number 5    Number of Visits 13    Date for PT Re-Evaluation 03/25/23    Authorization Type [employer] and MCD    Authorization Time Period 02/11/23 to 03/25/23    PT Start Time 0805    PT Stop Time 0845    PT Time Calculation (min) 40 min              Past Medical History:  Diagnosis Date   Anxiety    Bee sting-induced anaphylaxis    CAD (coronary artery disease)    Rocky Mountain spotted fever    Traumatic brain injury (HCC)    Vasculitis (HCC)    Past Surgical History:  Procedure Laterality Date   arm surgery     left arm / tendon    STENT REMOVAL     Patient Active Problem List   Diagnosis Date Noted   RUQ pain 07/27/2020   Antiplatelet or antithrombotic long-term use 07/27/2020   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 01/12/2013   LOC (loss of consciousness) (HCC) 01/12/2013   Hyperglycemia 01/12/2013   Fall 01/12/2013   Abrasion of face 01/12/2013   HYPERLIPIDEMIA 09/12/2008   SHOULDER PAIN, RIGHT 09/12/2008   PLANTAR FASCIITIS, LEFT 09/12/2008   OTHER ANXIETY STATES 08/25/2008   TOBACCO ABUSE 08/25/2008   PALPITATIONS 08/25/2008   DYSPNEA ON EXERTION 08/25/2008   NEPHROLITHIASIS, HX OF 08/25/2008    PCP: Angelina Pih MD   REFERRING PROVIDER: Beverely Low, MD  REFERRING DIAG: M54.2 (ICD-10-CM) - Cervicalgia  THERAPY DIAG:  Cervicalgia  Abnormal posture  Rationale for Evaluation and Treatment: Rehabilitation  ONSET DATE: 2.5 months ago  SUBJECTIVE:                                                                                                                                                                                                         SUBJECTIVE STATEMENT:  Sore and hurting. Like I worked out but it "hurt so good while  doing it" PERTINENT HISTORY:  Anxiety, CAD, rocky mountain spotted fever, TBI, vasculitis, arm surgery  PAIN:  Are you having pain? Yes: NPRS scale: 4/10 Pain location: L UE down arm  Pain description: tingling Aggravating factors: sitting up straight  Relieving factors: cocking head to right   PRECAUTIONS: Other: active tear R bicep, careful with loading too much, watch BP  OUTPATIENT PHYSICAL THERAPY CERVICAL TREATMENT   Patient Name: Darin White MRN: [id number] DOB:12-01-1965, 57 y.o., male Today's Date: 03/06/2023  END OF SESSION:  PT End of Session - 03/06/23 0804     Visit Number 5    Number of Visits 13    Date for PT Re-Evaluation 03/25/23    Authorization Type [employer] and MCD    Authorization Time Period 02/11/23 to 03/25/23    PT Start Time 0805    PT Stop Time 0845    PT Time Calculation (min) 40 min              Past Medical History:  Diagnosis Date   Anxiety    Bee sting-induced anaphylaxis    CAD (coronary artery disease)    Rocky Mountain spotted fever    Traumatic brain injury (HCC)    Vasculitis (HCC)    Past Surgical History:  Procedure Laterality Date   arm surgery     left arm / tendon    STENT REMOVAL     Patient Active Problem List   Diagnosis Date Noted   RUQ pain 07/27/2020   Antiplatelet or antithrombotic long-term use 07/27/2020   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 01/12/2013   LOC (loss of consciousness) (HCC) 01/12/2013   Hyperglycemia 01/12/2013   Fall 01/12/2013   Abrasion of face 01/12/2013   HYPERLIPIDEMIA 09/12/2008   SHOULDER PAIN, RIGHT 09/12/2008   PLANTAR FASCIITIS, LEFT 09/12/2008   OTHER ANXIETY STATES 08/25/2008   TOBACCO ABUSE 08/25/2008   PALPITATIONS 08/25/2008   DYSPNEA ON EXERTION 08/25/2008   NEPHROLITHIASIS, HX OF 08/25/2008    PCP: Angelina Pih MD   REFERRING PROVIDER: Beverely Low, MD  REFERRING DIAG: M54.2 (ICD-10-CM) - Cervicalgia  THERAPY DIAG:  Cervicalgia  Abnormal posture  Rationale for Evaluation and Treatment: Rehabilitation  ONSET DATE: 2.5 months ago  SUBJECTIVE:                                                                                                                                                                                                         SUBJECTIVE STATEMENT:  Sore and hurting. Like I worked out but it "hurt so good while  doing it" PERTINENT HISTORY:  Anxiety, CAD, rocky mountain spotted fever, TBI, vasculitis, arm surgery  PAIN:  Are you having pain? Yes: NPRS scale: 4/10 Pain location: L UE down arm  Pain description: tingling Aggravating factors: sitting up straight  Relieving factors: cocking head to right   PRECAUTIONS: Other: active tear R bicep, careful with loading too much, watch BP  Cryotherapy, Moist heat, Taping, Traction, Ultrasound, Ionotophoresis 4mg /ml Dexamethasone, Manual therapy, and Re-evaluation  PLAN FOR NEXT SESSION: assess today session and progress.       Patient Details  Name: Darin White MRN: [id number] Date of Birth: [birth year] Referring Provider:  Beverely Low, MD  Encounter Date: 03/06/2023   Suanne Marker, PTA 03/06/2023, 8:05 AM  [place] [facility] [address] [place], [place], [address] Phone: [phone]   Fax:  [phone] at [facility] [address] [place], [place], [address] Phone: [phone]   Fax:  [phone]  Patient Details  Name: Darin White MRN: [id number] Date of Birth: [birth year] Referring Provider:  Beverely Low, MD  Encounter Date: 03/06/2023   Suanne Marker, PTA 03/06/2023, 8:05 AM  [place] [facility] [address] [place], [place], [address] Phone: [phone]   Fax:  [phone]  OUTPATIENT PHYSICAL THERAPY CERVICAL TREATMENT   Patient Name: Darin White MRN: [id number] DOB:12-01-1965, 57 y.o., male Today's Date: 03/06/2023  END OF SESSION:  PT End of Session - 03/06/23 0804     Visit Number 5    Number of Visits 13    Date for PT Re-Evaluation 03/25/23    Authorization Type [employer] and MCD    Authorization Time Period 02/11/23 to 03/25/23    PT Start Time 0805    PT Stop Time 0845    PT Time Calculation (min) 40 min              Past Medical History:  Diagnosis Date   Anxiety    Bee sting-induced anaphylaxis    CAD (coronary artery disease)    Rocky Mountain spotted fever    Traumatic brain injury (HCC)    Vasculitis (HCC)    Past Surgical History:  Procedure Laterality Date   arm surgery     left arm / tendon    STENT REMOVAL     Patient Active Problem List   Diagnosis Date Noted   RUQ pain 07/27/2020   Antiplatelet or antithrombotic long-term use 07/27/2020   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 01/12/2013   LOC (loss of consciousness) (HCC) 01/12/2013   Hyperglycemia 01/12/2013   Fall 01/12/2013   Abrasion of face 01/12/2013   HYPERLIPIDEMIA 09/12/2008   SHOULDER PAIN, RIGHT 09/12/2008   PLANTAR FASCIITIS, LEFT 09/12/2008   OTHER ANXIETY STATES 08/25/2008   TOBACCO ABUSE 08/25/2008   PALPITATIONS 08/25/2008   DYSPNEA ON EXERTION 08/25/2008   NEPHROLITHIASIS, HX OF 08/25/2008    PCP: Angelina Pih MD   REFERRING PROVIDER: Beverely Low, MD  REFERRING DIAG: M54.2 (ICD-10-CM) - Cervicalgia  THERAPY DIAG:  Cervicalgia  Abnormal posture  Rationale for Evaluation and Treatment: Rehabilitation  ONSET DATE: 2.5 months ago  SUBJECTIVE:                                                                                                                                                                                                         SUBJECTIVE STATEMENT:  Sore and hurting. Like I worked out but it "hurt so good while  doing it" PERTINENT HISTORY:  Anxiety, CAD, rocky mountain spotted fever, TBI, vasculitis, arm surgery  PAIN:  Are you having pain? Yes: NPRS scale: 4/10 Pain location: L UE down arm  Pain description: tingling Aggravating factors: sitting up straight  Relieving factors: cocking head to right   PRECAUTIONS: Other: active tear R bicep, careful with loading too much, watch BP

## 2023-03-11 ENCOUNTER — Ambulatory Visit: Payer: Commercial Managed Care - HMO | Admitting: Physical Therapy

## 2023-03-11 DIAGNOSIS — M542 Cervicalgia: Secondary | ICD-10-CM | POA: Diagnosis not present

## 2023-03-11 DIAGNOSIS — R293 Abnormal posture: Secondary | ICD-10-CM

## 2023-03-11 NOTE — Therapy (Signed)
Therapy, Dry Needling, Electrical stimulation, Spinal mobilization, Cryotherapy, Moist heat, Taping, Traction, Ultrasound, Ionotophoresis 4mg /ml Dexamethasone, Manual therapy, and Re-evaluation  PLAN FOR NEXT SESSION: assess today session and progress.       Patient Details  Name: Darin White MRN: [id number] Date of Birth: 22-Feb-1966 Referring Provider:  Beverely Low, MD  Encounter Date: 03/11/2023   Suanne Marker, PTA 03/11/2023, 7:59 AM  [place] [facility] [address] [place], [place], [address] Phone: [phone]   Fax:  [phone] [address] [place], [place], [address] Phone: [phone]   Fax:  [phone]  Therapy, Dry Needling, Electrical stimulation, Spinal mobilization, Cryotherapy, Moist heat, Taping, Traction, Ultrasound, Ionotophoresis 4mg /ml Dexamethasone, Manual therapy, and Re-evaluation  PLAN FOR NEXT SESSION: assess today session and progress.       Patient Details  Name: Darin White MRN: [id number] Date of Birth: 22-Feb-1966 Referring Provider:  Beverely Low, MD  Encounter Date: 03/11/2023   Suanne Marker, PTA 03/11/2023, 7:59 AM  [place] [address] [place], [place], [address] Phone: [phone]   Fax:  [phone] [address] [place], [place], [address] Phone: [phone]   Fax:  [phone]  Therapy, Dry Needling, Electrical stimulation, Spinal mobilization, Cryotherapy, Moist heat, Taping, Traction, Ultrasound, Ionotophoresis 4mg /ml Dexamethasone, Manual therapy, and Re-evaluation  PLAN FOR NEXT SESSION: assess today session and progress.       Patient Details  Name: Darin White MRN: [id number] Date of Birth: 22-Feb-1966 Referring Provider:  Beverely Low, MD  Encounter Date: 03/11/2023   Suanne Marker, PTA 03/11/2023, 7:59 AM  [place] [address] [place], [place], [address] Phone: [phone]   Fax:  [phone] [address] [place], [place], [address] Phone: [phone]   Fax:  [phone]  OUTPATIENT PHYSICAL THERAPY CERVICAL TREATMENT   Patient Name: Darin White MRN: [id number] DOB:[birth year], 57 y.o., male Today's Date: 03/11/2023  END OF SESSION:  PT End of Session - 03/11/23 0759     Visit Number 6    Number of Visits 13    Date for PT Re-Evaluation 03/25/23    Authorization Type [employer] and M[employer]    Authorization Time Period 02/11/23 to 03/25/23    PT Start Time 0800    PT Stop Time 0845    PT Time Calculation (min) 45 min              Past Medical History:  Diagnosis Date   Anxiety    Bee sting-induced anaphylaxis    CAD (coronary artery disease)    Rocky Mountain spotted fever    Traumatic brain injury (HCC)    Vasculitis (HCC)    Past Surgical History:  Procedure Laterality Date   arm surgery     left arm / tendon    STENT REMOVAL     Patient Active Problem List   Diagnosis Date Noted   RUQ pain 07/27/2020   Antiplatelet or antithrombotic long-term use 07/27/2020   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 01/12/2013   LOC (loss of consciousness) (HCC) 01/12/2013   Hyperglycemia 01/12/2013   Fall 01/12/2013   Abrasion of face 01/12/2013   HYPERLIPIDEMIA 09/12/2008   SHOULDER PAIN, RIGHT 09/12/2008   PLANTAR FASCIITIS, LEFT 09/12/2008   OTHER ANXIETY STATES 08/25/2008   TOBACCO ABUSE 08/25/2008   PALPITATIONS 08/25/2008   DYSPNEA ON EXERTION 08/25/2008   NEPHROLITHIASIS, HX OF 08/25/2008    PCP: Angelina Pih MD   REFERRING PROVIDER: Beverely Low, MD  REFERRING DIAG: M54.2 (ICD-10-CM) - Cervicalgia  THERAPY DIAG:  Cervicalgia  Abnormal posture  Rationale for Evaluation and Treatment: Rehabilitation  ONSET DATE: 2.5 months ago  SUBJECTIVE:                                                                                                                                                                                                         SUBJECTIVE STATEMENT: last treatment helped. Working aggravates it. N/T comes and goes, RT  lateral flexion makes it go away   PERTINENT HISTORY:  Anxiety, CAD, rocky mountain spotted fever, TBI, vasculitis, arm surgery  PAIN:  Are you having pain? Yes: NPRS scale: 4/10 Pain location: L UE down arm  Pain description: tingling Aggravating factors: sitting up straight  Relieving factors: cocking head to right   PRECAUTIONS: Other: active tear R bicep, careful with loading too much,

## 2023-03-13 ENCOUNTER — Encounter: Payer: Self-pay | Admitting: Physical Therapy

## 2023-03-13 ENCOUNTER — Ambulatory Visit: Payer: Commercial Managed Care - HMO | Admitting: Physical Therapy

## 2023-03-13 DIAGNOSIS — M542 Cervicalgia: Secondary | ICD-10-CM

## 2023-03-13 DIAGNOSIS — R293 Abnormal posture: Secondary | ICD-10-CM

## 2023-03-13 DIAGNOSIS — R29898 Other symptoms and signs involving the musculoskeletal system: Secondary | ICD-10-CM

## 2023-03-13 NOTE — Therapy (Signed)
OUTPATIENT PHYSICAL THERAPY CERVICAL TREATMENT   Patient Name: Darin White MRN: [id number] DOB:[birth year], 57 y.o., male Today's Date: 03/13/2023  END OF SESSION:  PT End of Session - 03/13/23 0807     Visit Number 7    Date for PT Re-Evaluation 03/25/23    PT Start Time 0808    PT Stop Time 0851    PT Time Calculation (min) 43 min    Activity Tolerance Patient tolerated treatment well    Behavior During Therapy [facility] for tasks assessed/performed              Past Medical History:  Diagnosis Date   Anxiety    Bee sting-induced anaphylaxis    CAD (coronary artery disease)    [place] spotted fever    Traumatic brain injury (HCC)    Vasculitis (HCC)    Past Surgical History:  Procedure Laterality Date   arm surgery     left arm / tendon    STENT REMOVAL     Patient Active Problem List   Diagnosis Date Noted   RUQ pain 07/27/2020   Antiplatelet or antithrombotic long-term use 07/27/2020   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 01/12/2013   LOC (loss of consciousness) (HCC) 01/12/2013   Hyperglycemia 01/12/2013   Fall 01/12/2013   Abrasion of face 01/12/2013   HYPERLIPIDEMIA 09/12/2008   SHOULDER PAIN, RIGHT 09/12/2008   PLANTAR FASCIITIS, LEFT 09/12/2008   OTHER ANXIETY STATES 08/25/2008   TOBACCO ABUSE 08/25/2008   PALPITATIONS 08/25/2008   DYSPNEA ON EXERTION 08/25/2008   NEPHROLITHIASIS, HX OF 08/25/2008    PCP: Angelina Pih MD   REFERRING PROVIDER: Beverely Low, MD  REFERRING DIAG: M54.2 (ICD-10-CM) - Cervicalgia  THERAPY DIAG:  Cervicalgia  Other symptoms and signs involving the musculoskeletal system  Abnormal posture  Rationale for Evaluation and Treatment: Rehabilitation  ONSET DATE: 2.5 months ago  SUBJECTIVE:                                                                                                                                                                                                         SUBJECTIVE STATEMENT:  Rough today. Holds his head to the R to relieve the symptoms N/T down his L arm  PERTINENT HISTORY:  Anxiety, CAD, rocky mountain spotted fever, TBI, vasculitis, arm surgery  PAIN:  Are you having pain? Yes: NPRS scale: 4/10 Pain location: L UE down arm  Pain description: tingling Aggravating factors: sitting up straight  Relieving factors: cocking head to right   PRECAUTIONS: Other: active tear R bicep, careful with  loading too much, watch BP    RED FLAGS: None     WEIGHT BEARING RESTRICTIONS: No  FALLS:  Has patient fallen in last 6 months? 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  PLOF: Independent, Independent with basic ADLs, Independent with gait, and Independent with transfers  PATIENT GOALS: be able keep going with least amount of pain as possible   NEXT MD VISIT: Referring PRN  OBJECTIVE:     PATIENT SURVEYS:  FOTO 2nd session   COGNITION: Overall cognitive status: Within functional limits for tasks assessed  SENSATION: Reports some tingling/numbness L UE with certain head positions  POSTURE: rounded shoulders, forward head, and increased thoracic kyphosis  PALPATION: TTP around paraspinals high thoracic region   CERVICAL ROM:   Active ROM A/PROM (deg) eval  Flexion 20* p!  Extension 12* p!  Right lateral flexion 26*  Left lateral flexion 21* p!  Right rotation 75% limited p!  Left rotation 25% limited   (Blank rows = not tested)  UPPER EXTREMITY ROM:  Active ROM Right eval Left eval  Shoulder flexion [facility]  Shoulder extension    Shoulder abduction [facility]  Shoulder adduction    Shoulder extension    Shoulder internal rotation T12 T10  Shoulder external rotation C7 C7  Elbow flexion    Elbow extension    Wrist flexion    Wrist extension    Wrist ulnar deviation    Wrist radial deviation    Wrist pronation    Wrist  supination     (Blank rows = not tested)    TODAY'S TREATMENT:                                                                                                                              DATE:  03/13/23 US/estim combo left UT/cerv/rhom 8 min DSTW to above with TP release MH/IFC to above sitting  03/11/23 US/estim combo left UT/cerv/rhom 8 min DSTW to above with TP release MH/IFC to above sitting  03/06/23 US/estim combo left UT/cerv/rhom 8 min DSTW to above with TP release MH/IFC to above sitting  03/04/23 UBE 2 min fwd 2 min backward L 3 Cable pulley shld ext 10# 2 sets 10. 15# row 2 sets 10 Green tband ER 2 sets 10 Cerv retraction head on ball on wall 15x hold 3 sec Ball vs wall 5 x CW and CCW Lat pull down 20# 2 sets 10 Neural tension stretching LUE Mulligan cerv SNAGS and NAGS to increase cerv ROM DSTW with TP release left UT/rhomoid DN to above by MAlbright PT with twitch response    02/25/23  Attempted BP check due to recent hx of severe HTN, repeated error messages on machine, monitored sx closely and adjusted interventions PRN   TherEx  UBE L5 x6 minutes backwards, dropped to L4 due to increased RUE pain  3 way reaches with green TB on wall x10 B Chin tuck + rotation x10 B cues to not force movement through pain Chin tuck + lateral flexion x10 B cues to not force movement through pain        02/21/23 Neural tension stretching LUE Mulligan cerv SNAGS and NAGS to increase cerv ROM DSTW with TP release left UT/rhomoid DN to left Cerv/UT by Amy Speaks PT  - twitch response  Mech cerv traction supine 15 min Reviewed HEP and stressed importance of compliance   Eval   Objective measures, POC, HEP   Discussed/demonstrated and practiced all exercises in HEP  PATIENT EDUCATION:  Education details: exam findings, POC, HEP  Person educated: Patient Education method: Explanation, Demonstration, and Handouts Education comprehension: verbalized  understanding, returned demonstration, and needs further education  HOME EXERCISE PROGRAM: Access Code: [email or web address] URL: [email or web address] Date: 02/11/2023 Prepared by: Nedra Hai  Exercises - Seated Cervical Retraction  - 2 x daily - 7 x weekly - 1 sets - 10 reps - 3 seconds hold - Seated Thoracic Lumbar Extension with Pectoralis Stretch  - 2 x daily - 7 x weekly - 1 sets - 10 reps - 3 seconds  hold - Seated Scapular Retraction with External Rotation  - 2 x daily - 7 x weekly - 1 sets - 10 reps - 2 seconds  hold - Seated Thoracic Flexion and Rotation with Arms Crossed  - 2 x daily - 7 x weekly - 1 sets - 10 reps - 2 seconds  hold  ASSESSMENT:  CLINICAL IMPRESSION:  Pt continued to reports the most relief form modalities and STM works some session duplicated. He arrived ~ 8 minuted late. Overall less tightness , TP noted and responded to STW OBJECTIVE IMPAIRMENTS: decreased ROM, hypomobility, increased fascial restrictions, increased muscle spasms, impaired flexibility, impaired sensation, impaired UE functional use, improper body mechanics, and postural dysfunction.   ACTIVITY LIMITATIONS: carrying, lifting, reach over head, and caring for others  PARTICIPATION LIMITATIONS: driving, shopping, community activity, occupation, and yard work  PERSONAL FACTORS: Age, Behavior pattern, Education, Fitness, Past/current experiences, Profession, and Time since onset of injury/illness/exacerbation are also affecting patient's functional outcome.   REHAB POTENTIAL: Fair complex medical history, extensive hx of ortho injuries, chronicity of pain, physical job   CLINICAL DECISION MAKING: Stable/uncomplicated  EVALUATION COMPLEXITY: Low   GOALS: Goals reviewed with patient? No  SHORT TERM GOALS: Target date: 03/04/2023    Will be compliant with appropriate progressive HEP  Baseline:  Goal status: 03/04/23 met  2.  Cervical ROM to be WNL without increased pain  Baseline:   Goal status: 03/06/23 progressing  3.  Frequency and intensity of radicular sx in L UE to have improved by 50% Baseline:  Goal status: 03/04/23 progressing    LONG TERM GOALS: Target date: 03/25/2023    MMT to be 5/5 globally with no increased pain or radicular sx  Baseline:  Goal status: INITIAL  2.  Will be able to maintain upright midline posture with no increased pain  Baseline:  Goal status: INITIAL  3.  Frequency and intensity of radicular sx to have improved by 75% Baseline:  Goal status: INITIAL  4.  Will be able to sleep through the night without waking due to pain  Baseline:  Goal status: 03/11/23 progressing  5.  Will be able to perform all functional work based tasks at home and in the workplace without increase in pain  Baseline:  Goal status: 03/11/23 progressing  PLAN:  PT FREQUENCY: 2x/week  PT DURATION: 6 weeks  PLANNED INTERVENTIONS: Therapeutic exercises, Therapeutic activity, Neuromuscular re-education, Balance training, Gait training, Patient/Family education, Self Care, Joint mobilization, Aquatic Therapy, Dry Needling, Electrical stimulation, Spinal mobilization, Cryotherapy, Moist heat, Taping, Traction, Ultrasound, Ionotophoresis 4mg /ml Dexamethasone, Manual therapy, and Re-evaluation  PLAN FOR NEXT SESSION: assess today session and progress.    Grayce Sessions, PTA 03/13/2023, 8:50 AM

## 2023-03-18 ENCOUNTER — Ambulatory Visit: Payer: Commercial Managed Care - HMO | Attending: Orthopedic Surgery | Admitting: Physical Therapy

## 2023-03-18 DIAGNOSIS — R293 Abnormal posture: Secondary | ICD-10-CM | POA: Diagnosis present

## 2023-03-18 DIAGNOSIS — M542 Cervicalgia: Secondary | ICD-10-CM | POA: Insufficient documentation

## 2023-03-18 DIAGNOSIS — R29898 Other symptoms and signs involving the musculoskeletal system: Secondary | ICD-10-CM | POA: Diagnosis present

## 2023-03-18 NOTE — Therapy (Signed)
OUTPATIENT PHYSICAL THERAPY CERVICAL TREATMENT   Patient Name: Darin White MRN: [id number] DOB:1965-11-23, 57 y.o., male Today's Date: 03/18/2023  END OF SESSION:  PT End of Session - 03/18/23 0806     Visit Number 8    Number of Visits 13    Date for PT Re-Evaluation 03/25/23    Authorization Type [employer] and [employer]    Authorization Time Period 02/11/23 to 03/25/23    PT Start Time 0807    PT Stop Time 0845    PT Time Calculation (min) 38 min              Past Medical History:  Diagnosis Date   Anxiety    Bee sting-induced anaphylaxis    CAD (coronary artery disease)    Rocky [place] spotted fever    Traumatic brain injury (HCC)    Vasculitis (HCC)    Past Surgical History:  Procedure Laterality Date   arm surgery     left arm / tendon    STENT REMOVAL     Patient Active Problem List   Diagnosis Date Noted   RUQ pain 07/27/2020   Antiplatelet or antithrombotic long-term use 07/27/2020   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5 months ago  SUBJECTIVE:                                                                                                                                                                                                         SUBJECTIVE STATEMENT: not much difference. PT feels good and helps for that day. Cancel MD appt since still in  PT PERTINENT HISTORY:  Anxiety, CAD, rocky mountain spotted fever, TBI, vasculitis, arm surgery  PAIN:  Are you having pain? Yes: NPRS scale: 4/10 Pain location: L UE down arm  Pain description: tingling Aggravating factors: sitting up straight  Relieving factors: cocking head to right   PRECAUTIONS: Other: active tear R bicep, careful with loading too much, watch BP  RED FLAGS: None     WEIGHT BEARING RESTRICTIONS: No  FALLS:  Has patient fallen in last 6 months? 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  PLOF: Independent, Independent with basic ADLs, Independent with gait, and Independent with transfers  PATIENT GOALS: be able keep going with least amount of pain as possible   NEXT MD VISIT: Referring PRN  OBJECTIVE:     PATIENT SURVEYS:  FOTO 2nd session   COGNITION: Overall cognitive status: Within functional limits for tasks assessed  SENSATION: Reports some tingling/numbness L UE with certain head positions  POSTURE: rounded shoulders, forward head, and increased thoracic kyphosis  PALPATION: TTP around paraspinals high thoracic region   CERVICAL ROM:   Active ROM A/PROM (deg) eval  Flexion 20* p!  Extension 12* p!  Right lateral flexion 26*  Left lateral flexion 21* p!  Right rotation 75% limited p!  Left rotation 25% limited   (Blank rows = not tested)  UPPER EXTREMITY ROM:  Active ROM Right eval Left eval  Shoulder flexion [facility]  Shoulder extension    Shoulder abduction [facility]  Shoulder adduction    Shoulder extension    Shoulder internal rotation T12 T10  Shoulder external rotation C7 C7  Elbow flexion    Elbow extension    Wrist flexion    Wrist extension    Wrist ulnar deviation    Wrist radial deviation    Wrist pronation    Wrist supination     (Blank rows = not tested)    TODAY'S TREATMENT:                                                                                                                               DATE:   03/18/23 US/estim combo left UT/cerv/rhom 8 min DSTW to above with TP release MH/IFC to above sitting Cerv ROM [facility]  03/13/23 US/estim combo left UT/cerv/rhom 8 min DSTW to above with TP release MH/IFC to above sitting  03/11/23 US/estim combo left UT/cerv/rhom 8 min DSTW to above with TP release MH/IFC to above sitting  03/06/23 US/estim combo left UT/cerv/rhom 8 min DSTW to above with TP release MH/IFC to above sitting  03/04/23 UBE 2 min fwd 2 min backward L 3 Cable pulley shld ext 10# 2 sets 10. 15# row 2 sets 10 Green tband ER 2 sets 10 Cerv retraction head on ball on wall 15x hold 3 sec Ball vs wall 5 x CW and CCW Lat pull down 20# 2 sets 10 Neural tension stretching LUE Mulligan cerv SNAGS and NAGS to increase cerv ROM DSTW with TP release left UT/rhomoid DN to above by MAlbright PT with twitch response    02/25/23  Attempted BP check due to recent hx of severe HTN, repeated error messages on machine, monitored sx closely and adjusted interventions PRN   TherEx  UBE L5 x6 minutes backwards, dropped to L4 due to increased RUE pain  3 way reaches with green TB on wall x10 B Chin tuck + rotation x10 B cues to not force movement through pain Chin tuck + lateral flexion x10 B cues to not force movement through pain        02/21/23 Neural tension stretching LUE Mulligan cerv SNAGS and NAGS to increase cerv ROM DSTW with TP release left UT/rhomoid DN to left Cerv/UT by Amy Speaks PT  - twitch response  Mech cerv traction supine 15 min Reviewed HEP and stressed importance of compliance   Eval   Objective measures, POC, HEP   Discussed/demonstrated and practiced all exercises in HEP  PATIENT EDUCATION:  Education details: exam findings, POC, HEP  Person educated: Patient Education method: Explanation, Demonstration, and Handouts Education  comprehension: verbalized understanding, returned demonstration, and needs further education  HOME EXERCISE PROGRAM: Access Code: [email or web address] URL: [email or web address] Date: 02/11/2023 Prepared by: Nedra Hai  Exercises - Seated Cervical Retraction  - 2 x daily - 7 x weekly - 1 sets - 10 reps - 3 seconds hold - Seated Thoracic Lumbar Extension with Pectoralis Stretch  - 2 x daily - 7 x weekly - 1 sets - 10 reps - 3 seconds  hold - Seated Scapular Retraction with External Rotation  - 2 x daily - 7 x weekly - 1 sets - 10 reps - 2 seconds  hold - Seated Thoracic Flexion and Rotation with Arms Crossed  - 2 x daily - 7 x weekly - 1 sets - 10 reps - 2 seconds  hold  ASSESSMENT:  CLINICAL IMPRESSION:  Pt continued to reports the most relief from modalities and STM works some session duplicated. He arrived ~ 8 minuted late. Overall less tightness , TP noted and responded to STW. STG 2/3 met. Rec making MD follow up as only temp relief with PT. OBJECTIVE IMPAIRMENTS: decreased ROM, hypomobility, increased fascial restrictions, increased muscle spasms, impaired flexibility, impaired sensation, impaired UE functional use, improper body mechanics, and postural dysfunction.   ACTIVITY LIMITATIONS: carrying, lifting, reach over head, and caring for others  PARTICIPATION LIMITATIONS: driving, shopping, community activity, occupation, and yard work  PERSONAL FACTORS: Age, Behavior pattern, Education, Fitness, Past/current experiences, Profession, and Time since onset of injury/illness/exacerbation are also affecting patient's functional outcome.   REHAB POTENTIAL: Fair complex medical history, extensive hx of ortho injuries, chronicity of pain, physical job   CLINICAL DECISION MAKING: Stable/uncomplicated  EVALUATION COMPLEXITY: Low   GOALS: Goals reviewed with patient? No  SHORT TERM GOALS: Target date: 03/04/2023    Will be compliant with appropriate progressive HEP  Baseline:   Goal status: 03/04/23 met  2.  Cervical ROM to be WNL without increased pain  Baseline:  Goal status: 03/06/23 progressing  03/18/23 MET  3.  Frequency and intensity of radicular sx in L UE to have improved by 50% Baseline:  Goal status: 03/04/23 progressing  03/18/23 comes and goes    LONG TERM GOALS: Target date: 03/25/2023    MMT to be 5/5 globally with no increased pain or radicular sx  Baseline:  Goal status: INITIAL  2.  Will be able to maintain upright midline posture with no increased pain  Baseline:  Goal status: INITIAL  3.  Frequency and intensity of radicular sx to have improved by 75% Baseline:  Goal status: INITIAL  4.  Will be able to sleep through the night without waking due to pain  Baseline:  Goal  status: 03/11/23 progressing  5.  Will be able to perform all functional work based tasks at home and in the workplace without increase in pain  Baseline:  Goal status: 03/11/23 progressing     PLAN:  PT FREQUENCY: 2x/week  PT DURATION: 6 weeks  PLANNED INTERVENTIONS: Therapeutic exercises, Therapeutic activity, Neuromuscular re-education, Balance training, Gait training, Patient/Family education, Self Care, Joint mobilization, Aquatic Therapy, Dry Needling, Electrical stimulation, Spinal mobilization, Cryotherapy, Moist heat, Taping, Traction, Ultrasound, Ionotophoresis 4mg /ml Dexamethasone, Manual therapy, and Re-evaluation  PLAN FOR NEXT SESSION: assess today session and progress.    Danny Yackley,ANGIE, PTA 03/18/2023, 8:06 AM   [facility] [address] [facility]. [place], [place], [address] Phone: [phone]   Fax:  [phone]  Patient Details  Name: Darin White MRN: [id number] Date of Birth: 08-Oct-1965 Referring Provider:  Beverely Low, MD  Encounter Date: 03/18/2023   Suanne Marker, PTA 03/18/2023, 8:06 AM  [facility] [address] [facility] [address] [place], [place], [address] Phone: [phone]   Fax:  [phone]

## 2023-03-20 ENCOUNTER — Ambulatory Visit: Payer: Commercial Managed Care - HMO | Admitting: Physical Therapy

## 2023-03-21 ENCOUNTER — Encounter: Payer: Self-pay | Admitting: Physical Therapy

## 2023-03-21 ENCOUNTER — Ambulatory Visit: Payer: Commercial Managed Care - HMO | Admitting: Physical Therapy

## 2023-03-21 DIAGNOSIS — M542 Cervicalgia: Secondary | ICD-10-CM | POA: Diagnosis not present

## 2023-03-21 DIAGNOSIS — R29898 Other symptoms and signs involving the musculoskeletal system: Secondary | ICD-10-CM

## 2023-03-21 DIAGNOSIS — R293 Abnormal posture: Secondary | ICD-10-CM

## 2023-03-21 NOTE — Therapy (Signed)
OUTPATIENT PHYSICAL THERAPY CERVICAL TREATMENT   Patient Name: Darin White MRN: [id number] DOB:1965-12-05, 57 y.o., male Today's Date: 03/21/2023  END OF SESSION:  PT End of Session - 03/21/23 0832     Visit Number 9    Number of Visits 13    Date for PT Re-Evaluation 03/25/23    Authorization Type [employer] and MCD    PT Start Time 0800    PT Stop Time 0845    PT Time Calculation (min) 45 min    Activity Tolerance Patient tolerated treatment well    Behavior During Therapy WFL for tasks assessed/performed              Past Medical History:  Diagnosis Date   Anxiety    Bee sting-induced anaphylaxis    CAD (coronary artery disease)    [place] spotted fever    Traumatic brain injury (HCC)    Vasculitis (HCC)    Past Surgical History:  Procedure Laterality Date   arm surgery     left arm / tendon    STENT REMOVAL     Patient Active Problem List   Diagnosis Date Noted   RUQ pain 07/27/2020   Antiplatelet or antithrombotic long-term use 07/27/2020   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 01/12/2013   LOC (loss of consciousness) (HCC) 01/12/2013   Hyperglycemia 01/12/2013   Fall 01/12/2013   Abrasion of face 01/12/2013   HYPERLIPIDEMIA 09/12/2008   SHOULDER PAIN, RIGHT 09/12/2008   PLANTAR FASCIITIS, LEFT 09/12/2008   OTHER ANXIETY STATES 08/25/2008   TOBACCO ABUSE 08/25/2008   PALPITATIONS 08/25/2008   DYSPNEA ON EXERTION 08/25/2008   NEPHROLITHIASIS, HX OF 08/25/2008    PCP: Angelina Pih MD   REFERRING PROVIDER: Beverely Low, MD  REFERRING DIAG: M54.2 (ICD-10-CM) - Cervicalgia  THERAPY DIAG:  Cervicalgia  Other symptoms and signs involving the musculoskeletal system  Abnormal posture  Rationale for Evaluation and Treatment: Rehabilitation  ONSET DATE: 2.5 months ago  SUBJECTIVE:                                                                                                                                                                                                          SUBJECTIVE STATEMENT: I do think it is getting a little better, still some pain and numbenss in the left arm, tilt away and that goes away PERTINENT HISTORY:  Anxiety, CAD, [place] mountain spotted fever, TBI, vasculitis, arm surgery  PAIN:  Are you having pain? Yes: NPRS scale: 4/10 Pain location: L UE down arm  Pain description: tingling Aggravating  factors: sitting up straight  Relieving factors: cocking head to right   PRECAUTIONS: Other: active tear R bicep, careful with loading too much, watch BP    RED FLAGS: None     WEIGHT BEARING RESTRICTIONS: No  FALLS:  Has patient fallen in last 6 months? 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  PLOF: Independent, Independent with basic ADLs, Independent with gait, and Independent with transfers  PATIENT GOALS: be able keep going with least amount of pain as possible   NEXT MD VISIT: Referring PRN  OBJECTIVE:     PATIENT SURVEYS:  FOTO 2nd session   COGNITION: Overall cognitive status: Within functional limits for tasks assessed  SENSATION: Reports some tingling/numbness L UE with certain head positions  POSTURE: rounded shoulders, forward head, and increased thoracic kyphosis  PALPATION: TTP around paraspinals high thoracic region   CERVICAL ROM:   Active ROM A/PROM (deg) eval  Flexion 20* p!  Extension 12* p!  Right lateral flexion 26*  Left lateral flexion 21* p!  Right rotation 75% limited p!  Left rotation 25% limited   (Blank rows = not tested)  UPPER EXTREMITY ROM:  Active ROM Right eval Left eval  Shoulder flexion [facility]  Shoulder extension    Shoulder abduction [facility]  Shoulder adduction    Shoulder extension    Shoulder internal rotation T12 T10  Shoulder external rotation C7 C7  Elbow flexion    Elbow extension    Wrist flexion    Wrist  extension    Wrist ulnar deviation    Wrist radial deviation    Wrist pronation    Wrist supination     (Blank rows = not tested)    TODAY'S TREATMENT:                                                                                                                              DATE:  03/21/23 STM to the left upper trap Cervical mobs, 1st rib mobs PROM of the cervical spine to end range, end range contract relax MHP/IFC to the left upper trap  03/18/23 US/estim combo left UT/cerv/rhom 8 min DSTW to above with TP release MH/IFC to above sitting Cerv ROM [facility]  03/13/23 US/estim combo left UT/cerv/rhom 8 min DSTW to above with TP release MH/IFC to above sitting  03/11/23 US/estim combo left UT/cerv/rhom 8 min DSTW to above with TP release MH/IFC to above sitting  03/06/23 US/estim combo left UT/cerv/rhom 8 min DSTW to above with TP release MH/IFC to above sitting  03/04/23 UBE 2 min fwd 2 min backward L 3 Cable pulley shld ext 10# 2 sets 10. 15# row 2 sets 10 Green tband ER 2 sets 10 Cerv retraction head on ball on wall 15x hold 3 sec Ball vs wall 5 x CW and CCW Lat pull down 20# 2 sets  10 Neural tension stretching LUE Mulligan cerv SNAGS and NAGS to increase cerv ROM DSTW with TP release left UT/rhomoid DN to above by MAlbright PT with twitch response    02/25/23  Attempted BP check due to recent hx of severe HTN, repeated error messages on machine, monitored sx closely and adjusted interventions PRN   TherEx  UBE L5 x6 minutes backwards, dropped to L4 due to increased RUE pain  3 way reaches with green TB on wall x10 B Chin tuck + rotation x10 B cues to not force movement through pain Chin tuck + lateral flexion x10 B cues to not force movement through pain   02/21/23 Neural tension stretching LUE Mulligan cerv SNAGS and NAGS to increase cerv ROM DSTW with TP release left UT/rhomoid DN to left Cerv/UT by Amy Speaks PT  - twitch response  Mech cerv traction  supine 15 min Reviewed HEP and stressed importance of compliance   Eval   Objective measures, POC, HEP   Discussed/demonstrated and practiced all exercises in HEP  PATIENT EDUCATION:  Education details: exam findings, POC, HEP  Person educated: Patient Education method: Explanation, Demonstration, and Handouts Education comprehension: verbalized understanding, returned demonstration, and needs further education  HOME EXERCISE PROGRAM: Access Code: [email or web address] URL: [email or web address] Date: 02/11/2023 Prepared by: Nedra Hai  Exercises - Seated Cervical Retraction  - 2 x daily - 7 x weekly - 1 sets - 10 reps - 3 seconds hold - Seated Thoracic Lumbar Extension with Pectoralis Stretch  - 2 x daily - 7 x weekly - 1 sets - 10 reps - 3 seconds  hold - Seated Scapular Retraction with External Rotation  - 2 x daily - 7 x weekly - 1 sets - 10 reps - 2 seconds  hold - Seated Thoracic Flexion and Rotation with Arms Crossed  - 2 x daily - 7 x weekly - 1 sets - 10 reps - 2 seconds  hold  ASSESSMENT:  CLINICAL IMPRESSION:  Pt continued to reports the most relief from modalities and STM works some session duplicated. He had some mobilization pops, and when we tilt to the right his symptoms go away, I did some cervical and 1st rib mobs.  Still has some significant knots in the upper trap and the rhomboid OBJECTIVE IMPAIRMENTS: decreased ROM, hypomobility, increased fascial restrictions, increased muscle spasms, impaired flexibility, impaired sensation, impaired UE functional use, improper body mechanics, and postural dysfunction.   ACTIVITY LIMITATIONS: carrying, lifting, reach over head, and caring for others  PARTICIPATION LIMITATIONS: driving, shopping, community activity, occupation, and yard work  PERSONAL FACTORS: Age, Behavior pattern, Education, Fitness, Past/current experiences, Profession, and Time since onset of injury/illness/exacerbation are also affecting patient's  functional outcome.   REHAB POTENTIAL: Fair complex medical history, extensive hx of ortho injuries, chronicity of pain, physical job   CLINICAL DECISION MAKING: Stable/uncomplicated  EVALUATION COMPLEXITY: Low   GOALS: Goals reviewed with patient? No  SHORT TERM GOALS: Target date: 03/04/2023    Will be compliant with appropriate progressive HEP  Baseline:  Goal status: 03/04/23 met  2.  Cervical ROM to be WNL without increased pain  Baseline:  Goal status: 03/06/23 progressing  03/18/23 MET  3.  Frequency and intensity of radicular sx in L UE to have improved by 50% Baseline:  Goal status: 03/04/23 progressing  03/18/23 comes and goes    LONG TERM GOALS: Target date: 03/25/2023    MMT to be 5/5 globally with no increased pain or radicular sx  Baseline:  Goal  status: Iprogressing 03/21/23  2.  Will be able to maintain upright midline posture with no increased pain  Baseline:  Goal status: INITIAL  3.  Frequency and intensity of radicular sx to have improved by 75% Baseline:  Goal status:progressing 03/21/23  4.  Will be able to sleep through the night without waking due to pain  Baseline:  Goal status: 03/11/23 progressing  5.  Will be able to perform all functional work based tasks at home and in the workplace without increase in pain  Baseline:  Goal status: 03/11/23 progressing     PLAN:  PT FREQUENCY: 2x/week  PT DURATION: 6 weeks  PLANNED INTERVENTIONS: Therapeutic exercises, Therapeutic activity, Neuromuscular re-education, Balance training, Gait training, Patient/Family education, Self Care, Joint mobilization, Aquatic Therapy, Dry Needling, Electrical stimulation, Spinal mobilization, Cryotherapy, Moist heat, Taping, Traction, Ultrasound, Ionotophoresis 4mg /ml Dexamethasone, Manual therapy, and Re-evaluation  PLAN FOR NEXT SESSION: see how the mobilizations did    Shaquayla Klimas W, PT 03/21/2023, 8:32 AM   Phone: [phone]   Fax:  [phone]

## 2023-03-25 ENCOUNTER — Ambulatory Visit: Payer: Commercial Managed Care - HMO | Admitting: Physical Therapy

## 2023-03-25 DIAGNOSIS — R293 Abnormal posture: Secondary | ICD-10-CM

## 2023-03-25 DIAGNOSIS — M542 Cervicalgia: Secondary | ICD-10-CM

## 2023-03-25 DIAGNOSIS — R29898 Other symptoms and signs involving the musculoskeletal system: Secondary | ICD-10-CM

## 2023-03-25 NOTE — Therapy (Signed)
OUTPATIENT PHYSICAL THERAPY CERVICAL TREATMENT   Patient Name: Darin White MRN: [id number] DOB:[birth year], 57 y.o., male Today's Date: 03/25/2023  END OF SESSION:  PT End of Session - 03/25/23 0802     Visit Number 10    Number of Visits 13    Date for PT Re-Evaluation 03/25/23    Authorization Type [employer] and MCD    Authorization Time Period 02/11/23 to 03/25/23    PT Start Time 0800    PT Stop Time 0845    PT Time Calculation (min) 45 min              Past Medical History:  Diagnosis Date   Anxiety    Bee sting-induced anaphylaxis    CAD (coronary artery disease)    [place] spotted fever    Traumatic brain injury (HCC)    Vasculitis (HCC)    Past Surgical History:  Procedure Laterality Date   arm surgery     left arm / tendon    STENT REMOVAL     Patient Active Problem List   Diagnosis Date Noted   RUQ pain 07/27/2020   Antiplatelet or antithrombotic long-term use 07/27/2020   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 01/12/2013   LOC (loss of consciousness) (HCC) 01/12/2013   Hyperglycemia 01/12/2013   Fall 01/12/2013   Abrasion of face 01/12/2013   HYPERLIPIDEMIA 09/12/2008   SHOULDER PAIN, RIGHT 09/12/2008   PLANTAR FASCIITIS, LEFT 09/12/2008   OTHER ANXIETY STATES 08/25/2008   TOBACCO ABUSE 08/25/2008   PALPITATIONS 08/25/2008   DYSPNEA ON EXERTION 08/25/2008   NEPHROLITHIASIS, HX OF 08/25/2008    PCP: Angelina Pih MD   REFERRING PROVIDER: Beverely Low, MD  REFERRING DIAG: M54.2 (ICD-10-CM) - Cervicalgia  THERAPY DIAG:  Cervicalgia  Other symptoms and signs involving the musculoskeletal system  Abnormal posture  Rationale for Evaluation and Treatment: Rehabilitation  ONSET DATE: 2.5 months ago  SUBJECTIVE:                                                                                                                                                                                                         SUBJECTIVE STATEMENT:  last session gave me 4 days relief PERTINENT HISTORY:  Anxiety, CAD, [place] mountain spotted fever, TBI, vasculitis, arm surgery  PAIN:  Are you having pain? Yes: NPRS scale: 4/10 Pain location: L UE down arm  Pain description: tingling Aggravating factors: sitting up straight  Relieving factors: cocking head to right   PRECAUTIONS: Other: active tear R bicep, careful with loading too much, watch BP  RED FLAGS: None     WEIGHT BEARING RESTRICTIONS: No  FALLS:  Has patient fallen in last 6 months? 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  PLOF: Independent, Independent with basic ADLs, Independent with gait, and Independent with transfers  PATIENT GOALS: be able keep going with least amount of pain as possible   NEXT MD VISIT: Referring PRN  OBJECTIVE:   PATIENT SURVEYS:  FOTO 2nd session   COGNITION: Overall cognitive status: Within functional limits for tasks assessed  SENSATION: Reports some tingling/numbness L UE with certain head positions  POSTURE: rounded shoulders, forward head, and increased thoracic kyphosis  PALPATION: TTP around paraspinals high thoracic region   CERVICAL ROM:   Active ROM A/PROM (deg) eval  Flexion 20* p!  Extension 12* p!  Right lateral flexion 26*  Left lateral flexion 21* p!  Right rotation 75% limited p!  Left rotation 25% limited   (Blank rows = not tested)  UPPER EXTREMITY ROM:  Active ROM Right eval Left eval  Shoulder flexion [facility]  Shoulder extension    Shoulder abduction [facility]  Shoulder adduction    Shoulder extension    Shoulder internal rotation T12 T10  Shoulder external rotation C7 C7  Elbow flexion    Elbow extension    Wrist flexion    Wrist extension    Wrist ulnar deviation    Wrist radial deviation    Wrist pronation    Wrist supination     (Blank rows = not tested)    TODAY'S  TREATMENT:                                                                                                                              DATE:   03/25/23 US/estim combo left UT/cerv/rhom 8 min STM to the left upper trap with and without theragun PROM of the cervical spine to end range, end range contract relax MHP/IFC to the left upper trap 03/21/23 STM to the left upper trap Cervical mobs, 1st rib mobs PROM of the cervical spine to end range, end range contract relax MHP/IFC to the left upper trap  03/18/23 US/estim combo left UT/cerv/rhom 8 min DSTW to above with TP release MH/IFC to above sitting Cerv ROM [facility]  03/13/23 US/estim combo left UT/cerv/rhom 8 min DSTW to above with TP release MH/IFC to above sitting  03/11/23 US/estim combo left UT/cerv/rhom 8 min DSTW to above with TP release MH/IFC to above sitting  03/06/23 US/estim combo left UT/cerv/rhom 8 min DSTW to above with TP release MH/IFC to above sitting  03/04/23 UBE 2 min fwd 2 min backward L 3 Cable pulley shld ext 10# 2 sets 10. 15# row 2 sets 10 Green tband ER 2 sets 10 Cerv retraction head on ball on wall 15x hold 3 sec Ball vs wall 5 x CW and CCW Lat  pull down 20# 2 sets 10 Neural tension stretching LUE Mulligan cerv SNAGS and NAGS to increase cerv ROM DSTW with TP release left UT/rhomoid DN to above by MAlbright PT with twitch response  02/25/23 Attempted BP check due to recent hx of severe HTN, repeated error messages on machine, monitored sx closely and adjusted interventions PRN   TherEx  UBE L5 x6 minutes backwards, dropped to L4 due to increased RUE pain  3 way reaches with green TB on wall x10 B Chin tuck + rotation x10 B cues to not force movement through pain Chin tuck + lateral flexion x10 B cues to not force movement through pain  02/21/23 Neural tension stretching LUE Mulligan cerv SNAGS and NAGS to increase cerv ROM DSTW with TP release left UT/rhomoid DN to left Cerv/UT by Amy Speaks  PT  - twitch response  Mech cerv traction supine 15 min Reviewed HEP and stressed importance of compliance  Eval  Objective measures, POC, HEP   Discussed/demonstrated and practiced all exercises in HEP  PATIENT EDUCATION:  Education details: exam findings, POC, HEP  Person educated: Patient Education method: Explanation, Demonstration, and Handouts Education comprehension: verbalized understanding, returned demonstration, and needs further education  HOME EXERCISE PROGRAM: Access Code: [email or web address] URL: [email or web address] Date: 02/11/2023 Prepared by: Nedra Hai  Exercises - Seated Cervical Retraction  - 2 x daily - 7 x weekly - 1 sets - 10 reps - 3 seconds hold - Seated Thoracic Lumbar Extension with Pectoralis Stretch  - 2 x daily - 7 x weekly - 1 sets - 10 reps - 3 seconds  hold - Seated Scapular Retraction with External Rotation  - 2 x daily - 7 x weekly - 1 sets - 10 reps - 2 seconds  hold - Seated Thoracic Flexion and Rotation with Arms Crossed  - 2 x daily - 7 x weekly - 1 sets - 10 reps - 2 seconds  hold  ASSESSMENT:  CLINICAL IMPRESSION:  Pt continued to reports the most relief from modalities and STM works some session duplicated. Pt states 4 days relief after last session minimal numbness and did not have to cock head. Pt states did not make MD f/u as tx is helping. Goals assessed  OBJECTIVE IMPAIRMENTS: decreased ROM, hypomobility, increased fascial restrictions, increased muscle spasms, impaired flexibility, impaired sensation, impaired UE functional use, improper body mechanics, and postural dysfunction.   ACTIVITY LIMITATIONS: carrying, lifting, reach over head, and caring for others  PARTICIPATION LIMITATIONS: driving, shopping, community activity, occupation, and yard work  PERSONAL FACTORS: Age, Behavior pattern, Education, Fitness, Past/current experiences, Profession, and Time since onset of injury/illness/exacerbation are also affecting  patient's functional outcome.   REHAB POTENTIAL: Fair complex medical history, extensive hx of ortho injuries, chronicity of pain, physical job   CLINICAL DECISION MAKING: Stable/uncomplicated  EVALUATION COMPLEXITY: Low   GOALS: Goals reviewed with patient? No  SHORT TERM GOALS: Target date: 03/04/2023    Will be compliant with appropriate progressive HEP  Baseline:  Goal status: 03/04/23 met  2.  Cervical ROM to be WNL without increased pain  Baseline:  Goal status: 03/06/23 progressing  03/18/23 MET  3.  Frequency and intensity of radicular sx in L UE to have improved by 50% Baseline:  Goal status: 03/04/23 progressing  03/18/23 comes and goes  03/25/23 MET    LONG TERM GOALS: Target date: 03/25/2023    MMT to be 5/5 globally with no increased pain or radicular sx  Baseline:  Goal status: Iprogressing 03/21/23  Progressing 03/25/23 ER 4+/5  2.  Will be able to maintain upright midline posture with no increased pain  Baseline:  Goal status: 03/25/23 progressing  3.  Frequency and intensity of radicular sx to have improved by 75% Baseline:  Goal status:progressing 03/21/23  4.  Will be able to sleep through the night without waking due to pain  Baseline:  Goal status: 03/11/23 progressing  03/25/23 progressing  5.  Will be able to perform all functional work based tasks at home and in the workplace without increase in pain  Baseline:  Goal status: 03/11/23 progressing  and 03/25/23     PLAN:  PT FREQUENCY: 2x/week  PT DURATION: 6 weeks  PLANNED INTERVENTIONS: Therapeutic exercises, Therapeutic activity, Neuromuscular re-education, Balance training, Gait training, Patient/Family education, Self Care, Joint mobilization, Aquatic Therapy, Dry Needling, Electrical stimulation, Spinal mobilization, Cryotherapy, Moist heat, Taping, Traction, Ultrasound, Ionotophoresis 4mg /ml Dexamethasone, Manual therapy, and Re-evaluation  PLAN FOR NEXT SESSION: see how the mobilizations  did   Lacole Komorowski,ANGIE, PTA 03/25/2023, 8:03 AM   Phone: [phone]   Fax:  [phone] [address] [place], [place], [address] Phone: [phone]   Fax:  [phone]  Patient Details  Name: Hendrixx Severin MRN: [id number] Date of Birth: [birth year] Referring Provider:  Beverely Low, MD  Encounter Date: 03/25/2023  Oley Balm DPT 03/25/23 8:47 AM   Creedence Kunesh,ANGIE, PTA 03/25/2023, 8:03 AM  [place] [facility] [address] [place], [place], [address] Phone: [phone]   Fax:  [phone]

## 2023-03-27 ENCOUNTER — Ambulatory Visit: Payer: Commercial Managed Care - HMO | Admitting: Physical Therapy

## 2023-03-27 DIAGNOSIS — R29898 Other symptoms and signs involving the musculoskeletal system: Secondary | ICD-10-CM

## 2023-03-27 DIAGNOSIS — M542 Cervicalgia: Secondary | ICD-10-CM | POA: Diagnosis not present

## 2023-03-27 NOTE — Therapy (Signed)
OUTPATIENT PHYSICAL THERAPY CERVICAL TREATMENT   Patient Name: Darin White MRN: [id number] DOB:[birth year], 57 y.o., male Today's Date: 03/27/2023  END OF SESSION:  PT End of Session - 03/27/23 0800     Visit Number 11    Number of Visits 13    Date for PT Re-Evaluation 04/22/23    Authorization Type [employer] and MCD    Authorization Time Period 02/11/23 to 03/25/23    PT Start Time 0800    PT Stop Time 0845    PT Time Calculation (min) 45 min              Past Medical History:  Diagnosis Date   Anxiety    Bee sting-induced anaphylaxis    CAD (coronary artery disease)    Rocky Mountain spotted fever    Traumatic brain injury (HCC)    Vasculitis (HCC)    Past Surgical History:  Procedure Laterality Date   arm surgery     left arm / tendon    STENT REMOVAL     Patient Active Problem List   Diagnosis Date Noted   RUQ pain 07/27/2020   Antiplatelet or antithrombotic long-term use 07/27/2020   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 01/12/2013   LOC (loss of consciousness) (HCC) 01/12/2013   Hyperglycemia 01/12/2013   Fall 01/12/2013   Abrasion of face 01/12/2013   HYPERLIPIDEMIA 09/12/2008   SHOULDER PAIN, RIGHT 09/12/2008   PLANTAR FASCIITIS, LEFT 09/12/2008   OTHER ANXIETY STATES 08/25/2008   TOBACCO ABUSE 08/25/2008   PALPITATIONS 08/25/2008   DYSPNEA ON EXERTION 08/25/2008   NEPHROLITHIASIS, HX OF 08/25/2008    PCP: Angelina Pih MD   REFERRING PROVIDER: Beverely Low, MD  REFERRING DIAG: M54.2 (ICD-10-CM) - Cervicalgia  THERAPY DIAG:  Cervicalgia  Other symptoms and signs involving the musculoskeletal system  Rationale for Evaluation and Treatment: Rehabilitation  ONSET DATE: 2.5 months ago  SUBJECTIVE:                                                                                                                                                                                                         SUBJECTIVE STATEMENT: good until I slept  wrong last night PERTINENT HISTORY:  Anxiety, CAD, rocky mountain spotted fever, TBI, vasculitis, arm surgery  PAIN:  Are you having pain? Yes: NPRS scale: 4/10 Pain location: L UE down arm  Pain description: tingling Aggravating factors: sitting up straight  Relieving factors: cocking head to right   PRECAUTIONS: Other: active tear R bicep, careful with loading too much, watch BP    RED  FLAGS: None     WEIGHT BEARING RESTRICTIONS: No  FALLS:  Has patient fallen in last 6 months? 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  PLOF: Independent, Independent with basic ADLs, Independent with gait, and Independent with transfers  PATIENT GOALS: be able keep going with least amount of pain as possible   NEXT MD VISIT: Referring PRN  OBJECTIVE:   PATIENT SURVEYS:  FOTO 2nd session   COGNITION: Overall cognitive status: Within functional limits for tasks assessed  SENSATION: Reports some tingling/numbness L UE with certain head positions  POSTURE: rounded shoulders, forward head, and increased thoracic kyphosis  PALPATION: TTP around paraspinals high thoracic region   CERVICAL ROM:   Active ROM A/PROM (deg) eval  Flexion 20* p!  Extension 12* p!  Right lateral flexion 26*  Left lateral flexion 21* p!  Right rotation 75% limited p!  Left rotation 25% limited   (Blank rows = not tested)  UPPER EXTREMITY ROM:  Active ROM Right eval Left eval  Shoulder flexion [facility]  Shoulder extension    Shoulder abduction [facility]  Shoulder adduction    Shoulder extension    Shoulder internal rotation T12 T10  Shoulder external rotation C7 C7  Elbow flexion    Elbow extension    Wrist flexion    Wrist extension    Wrist ulnar deviation    Wrist radial deviation    Wrist pronation    Wrist supination     (Blank rows = not tested)    TODAY'S TREATMENT:                                                                                                                               DATE:   03/27/23 US/estim combo left UT/cerv/rhom 8 min STM to the left upper trap with and without theragun PROM of the cervical spine to end range, end range contract relax LEFT UE neural tension strtech and shld ROM, positive NT and limited ER MHP/IFC to the left upper trap   03/25/23 US/estim combo left UT/cerv/rhom 8 min STM to the left upper trap with and without theragun PROM of the cervical spine to end range, end range contract relax MHP/IFC to the left upper trap 03/21/23 STM to the left upper trap Cervical mobs, 1st rib mobs PROM of the cervical spine to end range, end range contract relax MHP/IFC to the left upper trap  03/18/23 US/estim combo left UT/cerv/rhom 8 min DSTW to above with TP release MH/IFC to above sitting Cerv ROM [facility]  03/13/23 US/estim combo left UT/cerv/rhom 8 min DSTW to above with TP release MH/IFC to above sitting  03/11/23 US/estim combo left UT/cerv/rhom 8 min DSTW to above with TP release MH/IFC to above sitting  03/06/23 US/estim combo left UT/cerv/rhom 8 min DSTW to above with TP release MH/IFC to above sitting  03/04/23 UBE 2 min fwd 2 min backward L 3 Cable pulley shld ext 10# 2 sets 10. 15# row 2 sets 10 Green tband ER 2 sets 10 Cerv retraction head on ball on wall 15x hold 3 sec Ball vs wall 5 x CW and CCW Lat pull down 20# 2 sets 10 Neural tension stretching LUE Mulligan cerv SNAGS and NAGS to increase cerv ROM DSTW with TP release left UT/rhomoid DN to above by MAlbright PT with twitch response  02/25/23 Attempted BP check due to recent hx of severe HTN, repeated error messages on machine, monitored sx closely and adjusted interventions PRN   TherEx  UBE L5 x6 minutes backwards, dropped to L4 due to increased RUE pain  3 way reaches with green TB on wall x10 B Chin tuck + rotation x10 B cues to not force  movement through pain Chin tuck + lateral flexion x10 B cues to not force movement through pain  02/21/23 Neural tension stretching LUE Mulligan cerv SNAGS and NAGS to increase cerv ROM DSTW with TP release left UT/rhomoid DN to left Cerv/UT by Amy Speaks PT  - twitch response  Mech cerv traction supine 15 min Reviewed HEP and stressed importance of compliance  Eval  Objective measures, POC, HEP   Discussed/demonstrated and practiced all exercises in HEP  PATIENT EDUCATION:  Education details: exam findings, POC, HEP  Person educated: Patient Education method: Explanation, Demonstration, and Handouts Education comprehension: verbalized understanding, returned demonstration, and needs further education  HOME EXERCISE PROGRAM: Access Code: [email or web address] URL: [email or web address] Date: 02/11/2023 Prepared by: Nedra Hai  Exercises - Seated Cervical Retraction  - 2 x daily - 7 x weekly - 1 sets - 10 reps - 3 seconds hold - Seated Thoracic Lumbar Extension with Pectoralis Stretch  - 2 x daily - 7 x weekly - 1 sets - 10 reps - 3 seconds  hold - Seated Scapular Retraction with External Rotation  - 2 x daily - 7 x weekly - 1 sets - 10 reps - 2 seconds  hold - Seated Thoracic Flexion and Rotation with Arms Crossed  - 2 x daily - 7 x weekly - 1 sets - 10 reps - 2 seconds  hold  ASSESSMENT:  CLINICAL IMPRESSION:  Pt continued to reports the most relief from modalities and STM . NT stretching as she was positive and tight with ROM esp ER. TP in Left rap but notably smaller OBJECTIVE IMPAIRMENTS: decreased ROM, hypomobility, increased fascial restrictions, increased muscle spasms, impaired flexibility, impaired sensation, impaired UE functional use, improper body mechanics, and postural dysfunction.   ACTIVITY LIMITATIONS: carrying, lifting, reach over head, and caring for others  PARTICIPATION LIMITATIONS: driving, shopping, community activity, occupation, and yard  work  PERSONAL FACTORS: Age, Behavior pattern, Education, Fitness, Past/current experiences, Profession, and Time since onset of injury/illness/exacerbation are also affecting patient's functional outcome.   REHAB POTENTIAL: Fair complex medical history, extensive hx of ortho injuries, chronicity of pain, physical job   CLINICAL DECISION MAKING: Stable/uncomplicated  EVALUATION COMPLEXITY: Low   GOALS: Goals reviewed with patient? No  SHORT TERM GOALS: Target date: 03/04/2023    Will be compliant with appropriate progressive HEP  Baseline:  Goal status: 03/04/23 met  2.  Cervical ROM to be WNL without increased pain  Baseline:  Goal status: 03/06/23 progressing  03/18/23 MET  3.  Frequency and intensity of radicular sx in L UE to have improved by 50% Baseline:  Goal status: 03/04/23 progressing  03/18/23 comes and goes  03/25/23 MET    LONG TERM GOALS: Target date: 03/25/2023    MMT to be 5/5 globally with no increased pain or radicular sx  Baseline:  Goal status: Iprogressing 03/21/23  Progressing 03/25/23 ER 4+/5  2.  Will be able to maintain upright midline posture with no increased pain  Baseline:  Goal status: 03/25/23 progressing  3.  Frequency and intensity of radicular sx to have improved by 75% Baseline:  Goal status:progressing 03/21/23  4.  Will be able to sleep through the night without waking due to pain  Baseline:  Goal status: 03/11/23 progressing  03/25/23 progressing  5.  Will be able to perform all functional work based tasks at home and in the workplace without increase in pain  Baseline:  Goal status: 03/11/23 progressing  and 03/25/23     PLAN:  PT FREQUENCY: 2x/week  PT DURATION: 6 weeks  PLANNED INTERVENTIONS: Therapeutic exercises, Therapeutic activity, Neuromuscular re-education, Balance training, Gait training, Patient/Family education, Self Care, Joint mobilization, Aquatic Therapy, Dry Needling, Electrical stimulation, Spinal mobilization,  Cryotherapy, Moist heat, Taping, Traction, Ultrasound, Ionotophoresis 4mg /ml Dexamethasone, Manual therapy, and Re-evaluation  PLAN FOR NEXT SESSION: progress Sydnie Sigmund,ANGIE, PTA 03/27/2023, 8:36 AM   Phone: [phone]   Fax:  [phone] [address] [place], [place], [address] Phone: [phone]   Fax:  [phone]  Patient Details  Name: Caio Devera MRN: [id number] Date of Birth: 1965-07-22 Referring Provider:  Beverely Low, MD  Encounter Date: 03/27/2023    Suanne Marker, PTA 03/27/2023, 8:36 AM  [place] [facility] at [facility] [address] [place], [place], [address] Phone: [phone]   Fax:  [phone] at [facility] [address] [place], [place], [address] Phone: [phone]   Fax:  [phone]  Patient Details  Name: Luisantonio Adinolfi MRN: [id number] Date of Birth: Oct 25, 1965 Referring Provider:  Beverely Low, MD  Encounter Date: 03/27/2023   Suanne Marker, PTA 03/27/2023, 8:36 AM  [place] [facility] at [facility] [address] [place], [place], [address] Phone: [phone]   Fax:  [phone]

## 2023-04-04 ENCOUNTER — Ambulatory Visit: Payer: Commercial Managed Care - HMO | Admitting: Physical Therapy

## 2023-04-04 DIAGNOSIS — M542 Cervicalgia: Secondary | ICD-10-CM

## 2023-04-04 DIAGNOSIS — R293 Abnormal posture: Secondary | ICD-10-CM

## 2023-04-04 DIAGNOSIS — R29898 Other symptoms and signs involving the musculoskeletal system: Secondary | ICD-10-CM

## 2023-04-04 NOTE — Therapy (Signed)
OUTPATIENT PHYSICAL THERAPY CERVICAL TREATMENT   Patient Name: Darin White MRN: [id number] DOB:1965-11-05, 57 y.o., male Today's Date: 04/04/2023  END OF SESSION:  PT End of Session - 04/04/23 0801     Visit Number 12    Date for PT Re-Evaluation 04/22/23    Authorization Type [employer] and MCD    PT Start Time 0800    PT Stop Time 0845    PT Time Calculation (min) 45 min              Past Medical History:  Diagnosis Date   Anxiety    Bee sting-induced anaphylaxis    CAD (coronary artery disease)    Rocky Mountain spotted fever    Traumatic brain injury (HCC)    Vasculitis (HCC)    Past Surgical History:  Procedure Laterality Date   arm surgery     left arm / tendon    STENT REMOVAL     Patient Active Problem List   Diagnosis Date Noted   RUQ pain 07/27/2020   Antiplatelet or antithrombotic long-term use 07/27/2020   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 01/12/2013   LOC (loss of consciousness) (HCC) 01/12/2013   Hyperglycemia 01/12/2013   Fall 01/12/2013   Abrasion of face 01/12/2013   HYPERLIPIDEMIA 09/12/2008   SHOULDER PAIN, RIGHT 09/12/2008   PLANTAR FASCIITIS, LEFT 09/12/2008   OTHER ANXIETY STATES 08/25/2008   TOBACCO ABUSE 08/25/2008   PALPITATIONS 08/25/2008   DYSPNEA ON EXERTION 08/25/2008   NEPHROLITHIASIS, HX OF 08/25/2008    PCP: Angelina Pih MD   REFERRING PROVIDER: Beverely Low, MD  REFERRING DIAG: M54.2 (ICD-10-CM) - Cervicalgia  THERAPY DIAG:  Cervicalgia  Other symptoms and signs involving the musculoskeletal system  Abnormal posture  Rationale for Evaluation and Treatment: Rehabilitation  ONSET DATE: 2.5 months ago  SUBJECTIVE:                                                                                                                                                                                                         SUBJECTIVE STATEMENT: rough week, 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 Decreased neck motion  and radiating down RT arm more again PERTINENT HISTORY:  Anxiety, CAD, rocky mountain spotted fever, TBI, vasculitis, arm surgery  PAIN:  Are you having pain? Yes: NPRS scale: 5/10 Pain location: L UE down arm  Pain description: tingling Aggravating factors: sitting up straight  Relieving factors: cocking head to right   PRECAUTIONS: Other: active tear R bicep, careful with loading too much, watch BP    RED  FLAGS: None     WEIGHT BEARING RESTRICTIONS: No  FALLS:  Has patient fallen in last 6 months? 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  PLOF: Independent, Independent with basic ADLs, Independent with gait, and Independent with transfers  PATIENT GOALS: be able keep going with least amount of pain as possible   NEXT MD VISIT: Referring PRN  OBJECTIVE:   PATIENT SURVEYS:  FOTO 2nd session   COGNITION: Overall cognitive status: Within functional limits for tasks assessed  SENSATION: Reports some tingling/numbness L UE with certain head positions  POSTURE: rounded shoulders, forward head, and increased thoracic kyphosis  PALPATION: TTP around paraspinals high thoracic region   CERVICAL ROM:   Active ROM A/PROM (deg) eval  Flexion 20* p!  Extension 12* p!  Right lateral flexion 26*  Left lateral flexion 21* p!  Right rotation 75% limited p!  Left rotation 25% limited   (Blank rows = not tested)  UPPER EXTREMITY ROM:  Active ROM Right eval Left eval  Shoulder flexion [facility]  Shoulder extension    Shoulder abduction [facility]  Shoulder adduction    Shoulder extension    Shoulder internal rotation T12 T10  Shoulder external rotation C7 C7  Elbow flexion    Elbow extension    Wrist flexion    Wrist extension    Wrist ulnar deviation    Wrist radial deviation    Wrist pronation    Wrist supination     (Blank rows = not  tested)    TODAY'S TREATMENT:                                                                                                                              DATE:   04/04/23 US/estim combo left UT/cerv/rhom 10 min STM to the left upper trap and rhomboid PROM of the cervical spine to end range, end range contract relax Mulligan cerv SNAGS and NAGS to increase cerv rotation and lateral flexion MHP/IFC to the left upper trap  03/27/23 US/estim combo left UT/cerv/rhom 8 min STM to the left upper trap with and without theragun PROM of the cervical spine to end range, end range contract relax LEFT UE neural tension strtech and shld ROM, positive NT and limited ER MHP/IFC to the left upper trap   03/25/23 US/estim combo left UT/cerv/rhom 8 min STM to the left upper trap with and without theragun PROM of the cervical spine to end range, end range contract relax MHP/IFC to the left upper trap 03/21/23 STM to the left upper trap Cervical mobs, 1st rib mobs PROM of the cervical spine to end range, end range contract relax MHP/IFC to the left upper trap  03/18/23 US/estim combo left UT/cerv/rhom 8 min DSTW to above with TP release MH/IFC to above sitting Cerv ROM [facility]  03/13/23 US/estim combo left UT/cerv/rhom 8 min DSTW  to above with TP release MH/IFC to above sitting  03/11/23 US/estim combo left UT/cerv/rhom 8 min DSTW to above with TP release MH/IFC to above sitting  03/06/23 US/estim combo left UT/cerv/rhom 8 min DSTW to above with TP release MH/IFC to above sitting  03/04/23 UBE 2 min fwd 2 min backward L 3 Cable pulley shld ext 10# 2 sets 10. 15# row 2 sets 10 Green tband ER 2 sets 10 Cerv retraction head on ball on wall 15x hold 3 sec Ball vs wall 5 x CW and CCW Lat pull down 20# 2 sets 10 Neural tension stretching LUE Mulligan cerv SNAGS and NAGS to increase cerv ROM DSTW with TP release left UT/rhomoid DN to above by MAlbright PT with twitch  response  02/25/23 Attempted BP check due to recent hx of severe HTN, repeated error messages on machine, monitored sx closely and adjusted interventions PRN   TherEx  UBE L5 x6 minutes backwards, dropped to L4 due to increased RUE pain  3 way reaches with green TB on wall x10 B Chin tuck + rotation x10 B cues to not force movement through pain Chin tuck + lateral flexion x10 B cues to not force movement through pain  02/21/23 Neural tension stretching LUE Mulligan cerv SNAGS and NAGS to increase cerv ROM DSTW with TP release left UT/rhomoid DN to left Cerv/UT by Amy Speaks PT  - twitch response  Mech cerv traction supine 15 min Reviewed HEP and stressed importance of compliance  Eval  Objective measures, POC, HEP   Discussed/demonstrated and practiced all exercises in HEP  PATIENT EDUCATION:  Education details: exam findings, POC, HEP  Person educated: Patient Education method: Explanation, Demonstration, and Handouts Education comprehension: verbalized understanding, returned demonstration, and needs further education  HOME EXERCISE PROGRAM: Access Code: [email or web address] URL: [email or web address] Date: 02/11/2023 Prepared by: Nedra Hai  Exercises - Seated Cervical Retraction  - 2 x daily - 7 x weekly - 1 sets - 10 reps - 3 seconds hold - Seated Thoracic Lumbar Extension with Pectoralis Stretch  - 2 x daily - 7 x weekly - 1 sets - 10 reps - 3 seconds  hold - Seated Scapular Retraction with External Rotation  - 2 x daily - 7 x weekly - 1 sets - 10 reps - 2 seconds  hold - Seated Thoracic Flexion and Rotation with Arms Crossed  - 2 x daily - 7 x weekly - 1 sets - 10 reps - 2 seconds  hold  ASSESSMENT:  CLINICAL IMPRESSION:  Pt arrives with increased pain since helping in 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 Pt with limited end range ROM esp rotation and lateral flexion and responded well to mulligan  MT. Assessed goals-no changes  OBJECTIVE IMPAIRMENTS: decreased ROM, hypomobility, increased fascial restrictions, increased muscle spasms, impaired flexibility, impaired sensation, impaired UE functional use, improper body mechanics, and postural dysfunction.   ACTIVITY LIMITATIONS: carrying, lifting, reach over head, and caring for others  PARTICIPATION LIMITATIONS: driving, shopping, community activity, occupation, and yard work  PERSONAL FACTORS: Age, Behavior pattern, Education, Fitness, Past/current experiences, Profession, and Time since onset of injury/illness/exacerbation are also affecting patient's functional outcome.   REHAB POTENTIAL: Fair complex medical history, extensive hx of ortho injuries, chronicity of pain, physical job   CLINICAL DECISION MAKING: Stable/uncomplicated  EVALUATION COMPLEXITY: Low   GOALS: Goals reviewed with patient? No  SHORT TERM GOALS: Target date: 03/04/2023    Will be compliant with  appropriate progressive HEP  Baseline:  Goal status: 03/04/23 met  2.  Cervical ROM to be WNL without increased pain  Baseline:  Goal status: 03/06/23 progressing  03/18/23 MET  3.  Frequency and intensity of radicular sx in L UE to have improved by 50% Baseline:  Goal status: 03/04/23 progressing  03/18/23 comes and goes  03/25/23 MET    LONG TERM GOALS: Target date: 03/25/2023    MMT to be 5/5 globally with no increased pain or radicular sx  Baseline:  Goal status: Iprogressing 03/21/23  Progressing 03/25/23 ER 4+/5 No changes 04/04/23  2.  Will be able to maintain upright midline posture with no increased pain  Baseline:  Goal status: 03/25/23 progressing  04/03/23 progressing  3.  Frequency and intensity of radicular sx to have improved by 75% Baseline:  Goal status:progressing 03/21/23 and 04/04/23  4.  Will be able to sleep through the night without waking due to pain  Baseline:  Goal status: 03/11/23 progressing  03/25/23 progressing and  04/04/23  5.  Will be able to perform all functional work based tasks at home and in the workplace without increase in pain  Baseline:  Goal status: 03/11/23 progressing  and 03/25/23 and 04/04/23     PLAN:  PT FREQUENCY: 2x/week  PT DURATION: 6 weeks  PLANNED INTERVENTIONS: Therapeutic exercises, Therapeutic activity, Neuromuscular re-education, Balance training, Gait training, Patient/Family education, Self Care, Joint mobilization, Aquatic Therapy, Dry Needling, Electrical stimulation, Spinal mobilization, Cryotherapy, Moist heat, Taping, Traction, Ultrasound, Ionotophoresis 4mg /ml Dexamethasone, Manual therapy, and Re-evaluation  PLAN FOR NEXT SESSION: progress Darin White,ANGIE, PTA 04/04/2023, 8:02 AM   Phone: [phone]   Fax:  [phone] at [facility] [address] [place], [place], [address] Phone: [phone]   Fax:  [phone]  Patient Details  Name: Aashir Portwood MRN: [id number] Date of Birth: February 05, 1966 Referring Provider:  Galvin Proffer, MD  Encounter Date: 04/04/2023    Suanne Marker, PTA 04/04/2023, 8:02 AM  [place] [facility] at [facility] [address] [place], [place], [address] Phone: [phone]   Fax:  [phone] at [facility] [address] [place], [place], [address] Phone: [phone]   Fax:  [phone]  Patient Details  Name: Zaniel Kosar MRN: [id number] Date of Birth: 03/27/1966 Referring Provider:  Galvin Proffer, MD  Encounter Date: 04/04/2023   Suanne Marker, PTA 04/04/2023, 8:02 AM  [place] [facility] at [facility] [address] [place], [place], [address] Phone: [phone]   Fax:  [phone] at [facility] [address] [place], [place], [address] Phone: [phone]   Fax:   [phone]

## 2023-04-08 ENCOUNTER — Ambulatory Visit: Payer: Commercial Managed Care - HMO | Admitting: Physical Therapy

## 2023-04-08 DIAGNOSIS — R293 Abnormal posture: Secondary | ICD-10-CM

## 2023-04-08 DIAGNOSIS — R29898 Other symptoms and signs involving the musculoskeletal system: Secondary | ICD-10-CM

## 2023-04-08 DIAGNOSIS — M542 Cervicalgia: Secondary | ICD-10-CM | POA: Diagnosis not present

## 2023-04-08 NOTE — Therapy (Signed)
OUTPATIENT PHYSICAL THERAPY CERVICAL TREATMENT   Patient Name: Darin White MRN: [id number] DOB:[birth year], 57 y.o., male Today's Date: 04/08/2023  END OF SESSION:  PT End of Session - 04/08/23 0831     Visit Number 13    Date for PT Re-Evaluation 04/22/23    PT Start Time 0800    PT Stop Time 0845    PT Time Calculation (min) 45 min              Past Medical History:  Diagnosis Date   Anxiety    Bee sting-induced anaphylaxis    CAD (coronary artery disease)    Rocky Mountain spotted fever    Traumatic brain injury (HCC)    Vasculitis (HCC)    Past Surgical History:  Procedure Laterality Date   arm surgery     left arm / tendon    STENT REMOVAL     Patient Active Problem List   Diagnosis Date Noted   RUQ pain 07/27/2020   Antiplatelet or antithrombotic long-term use 07/27/2020   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 01/12/2013   LOC (loss of consciousness) (HCC) 01/12/2013   Hyperglycemia 01/12/2013   Fall 01/12/2013   Abrasion of face 01/12/2013   HYPERLIPIDEMIA 09/12/2008   SHOULDER PAIN, RIGHT 09/12/2008   PLANTAR FASCIITIS, LEFT 09/12/2008   OTHER ANXIETY STATES 08/25/2008   TOBACCO ABUSE 08/25/2008   PALPITATIONS 08/25/2008   DYSPNEA ON EXERTION 08/25/2008   NEPHROLITHIASIS, HX OF 08/25/2008    PCP: Angelina Pih MD   REFERRING PROVIDER: Beverely Low, MD  REFERRING DIAG: M54.2 (ICD-10-CM) - Cervicalgia  THERAPY DIAG:  Cervicalgia  Other symptoms and signs involving the musculoskeletal system  Abnormal posture  Rationale for Evaluation and Treatment: Rehabilitation  ONSET DATE: 2.5 months ago  SUBJECTIVE:                                                                                                                                                                                                         SUBJECTIVE STATEMENT: got out of bed and pulled something in upper LEft side of back radiating around to front. Overall pain and N/T not  waking me at night and N/T is 70% better overall  PERTINENT HISTORY:  Anxiety, CAD, rocky mountain spotted fever, TBI, vasculitis, arm surgery  PAIN:  Are you having pain? Yes: NPRS scale: 5/10 Pain location: L UE down arm  Pain description: tingling Aggravating factors: sitting up straight  Relieving factors: cocking head to right   PRECAUTIONS: Other: active tear R bicep, careful with loading too much, watch  BP    RED FLAGS: None     WEIGHT BEARING RESTRICTIONS: No  FALLS:  Has patient fallen in last 6 months? 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  PLOF: Independent, Independent with basic ADLs, Independent with gait, and Independent with transfers  PATIENT GOALS: be able keep going with least amount of pain as possible   NEXT MD VISIT: Referring PRN  OBJECTIVE:   PATIENT SURVEYS:  FOTO 2nd session   COGNITION: Overall cognitive status: Within functional limits for tasks assessed  SENSATION: Reports some tingling/numbness L UE with certain head positions  POSTURE: rounded shoulders, forward head, and increased thoracic kyphosis  PALPATION: TTP around paraspinals high thoracic region   CERVICAL ROM:   Active ROM A/PROM (deg) eval  Flexion 20* p!  Extension 12* p!  Right lateral flexion 26*  Left lateral flexion 21* p!  Right rotation 75% limited p!  Left rotation 25% limited   (Blank rows = not tested)  UPPER EXTREMITY ROM:  Active ROM Right eval Left eval  Shoulder flexion [facility]  Shoulder extension    Shoulder abduction [facility]  Shoulder adduction    Shoulder extension    Shoulder internal rotation T12 T10  Shoulder external rotation C7 C7  Elbow flexion    Elbow extension    Wrist flexion    Wrist extension    Wrist ulnar deviation    Wrist radial deviation    Wrist pronation    Wrist supination     (Blank rows = not  tested)    TODAY'S TREATMENT:                                                                                                                              DATE:   04/08/23  DSTW in prone to thoracic and cerv TP and tightness RT thoracic and left UT Supine thoracic stretching with overpressure Cerv manual traction and stretching IFC and MH in supine to above     04/04/23 US/estim combo left UT/cerv/rhom 10 min STM to the left upper trap and rhomboid PROM of the cervical spine to end range, end range contract relax Mulligan cerv SNAGS and NAGS to increase cerv rotation and lateral flexion MHP/IFC to the left upper trap  03/27/23 US/estim combo left UT/cerv/rhom 8 min STM to the left upper trap with and without theragun PROM of the cervical spine to end range, end range contract relax LEFT UE neural tension strtech and shld ROM, positive NT and limited ER MHP/IFC to the left upper trap   03/25/23 US/estim combo left UT/cerv/rhom 8 min STM to the left upper trap with and without theragun PROM of the cervical spine to end range, end range contract relax MHP/IFC to the left upper trap 03/21/23 STM to the left upper trap Cervical mobs, 1st rib mobs PROM of the cervical spine  to end range, end range contract relax MHP/IFC to the left upper trap  03/18/23 US/estim combo left UT/cerv/rhom 8 min DSTW to above with TP release MH/IFC to above sitting Cerv ROM [facility]  03/13/23 US/estim combo left UT/cerv/rhom 8 min DSTW to above with TP release MH/IFC to above sitting  03/11/23 US/estim combo left UT/cerv/rhom 8 min DSTW to above with TP release MH/IFC to above sitting  03/06/23 US/estim combo left UT/cerv/rhom 8 min DSTW to above with TP release MH/IFC to above sitting  03/04/23 UBE 2 min fwd 2 min backward L 3 Cable pulley shld ext 10# 2 sets 10. 15# row 2 sets 10 Green tband ER 2 sets 10 Cerv retraction head on ball on wall 15x hold 3 sec Ball vs wall 5 x CW and  CCW Lat pull down 20# 2 sets 10 Neural tension stretching LUE Mulligan cerv SNAGS and NAGS to increase cerv ROM DSTW with TP release left UT/rhomoid DN to above by MAlbright PT with twitch response  02/25/23 Attempted BP check due to recent hx of severe HTN, repeated error messages on machine, monitored sx closely and adjusted interventions PRN   TherEx  UBE L5 x6 minutes backwards, dropped to L4 due to increased RUE pain  3 way reaches with green TB on wall x10 B Chin tuck + rotation x10 B cues to not force movement through pain Chin tuck + lateral flexion x10 B cues to not force movement through pain  02/21/23 Neural tension stretching LUE Mulligan cerv SNAGS and NAGS to increase cerv ROM DSTW with TP release left UT/rhomoid DN to left Cerv/UT by Amy Speaks PT  - twitch response  Mech cerv traction supine 15 min Reviewed HEP and stressed importance of compliance  Eval  Objective measures, POC, HEP   Discussed/demonstrated and practiced all exercises in HEP  PATIENT EDUCATION:  Education details: exam findings, POC, HEP  Person educated: Patient Education method: Explanation, Demonstration, and Handouts Education comprehension: verbalized understanding, returned demonstration, and needs further education  HOME EXERCISE PROGRAM: Access Code: [email or web address] URL: [email or web address] Date: 02/11/2023 Prepared by: Nedra Hai  Exercises - Seated Cervical Retraction  - 2 x daily - 7 x weekly - 1 sets - 10 reps - 3 seconds hold - Seated Thoracic Lumbar Extension with Pectoralis Stretch  - 2 x daily - 7 x weekly - 1 sets - 10 reps - 3 seconds  hold - Seated Scapular Retraction with External Rotation  - 2 x daily - 7 x weekly - 1 sets - 10 reps - 2 seconds  hold - Seated Thoracic Flexion and Rotation with Arms Crossed  - 2 x daily - 7 x weekly - 1 sets - 10 reps - 2 seconds  hold  ASSESSMENT:  CLINICAL IMPRESSION:  assessed goals and documented. No issues now  sleeping through night d/t pain and N/T symptoms 70% better. TP RT thoracic and left UT OBJECTIVE IMPAIRMENTS: decreased ROM, hypomobility, increased fascial restrictions, increased muscle spasms, impaired flexibility, impaired sensation, impaired UE functional use, improper body mechanics, and postural dysfunction.   ACTIVITY LIMITATIONS: carrying, lifting, reach over head, and caring for others  PARTICIPATION LIMITATIONS: driving, shopping, community activity, occupation, and yard work  PERSONAL FACTORS: Age, Behavior pattern, Education, Fitness, Past/current experiences, Profession, and Time since onset of injury/illness/exacerbation are also affecting patient's functional outcome.   REHAB POTENTIAL: Fair complex medical history, extensive hx of ortho injuries, chronicity of pain, physical job   CLINICAL DECISION MAKING: Stable/uncomplicated  EVALUATION COMPLEXITY: Low  GOALS: Goals reviewed with patient? No  SHORT TERM GOALS: Target date: 03/04/2023    Will be compliant with appropriate progressive HEP  Baseline:  Goal status: 03/04/23 met  2.  Cervical ROM to be WNL without increased pain  Baseline:  Goal status: 03/06/23 progressing  03/18/23 MET  3.  Frequency and intensity of radicular sx in L UE to have improved by 50% Baseline:  Goal status: 03/04/23 progressing  03/18/23 comes and goes  03/25/23 MET    LONG TERM GOALS: Target date: 03/25/2023    MMT to be 5/5 globally with no increased pain or radicular sx  Baseline:  Goal status: Iprogressing 03/21/23  Progressing 03/25/23 ER 4+/5 No changes 04/04/23  2.  Will be able to maintain upright midline posture with no increased pain  Baseline:  Goal status: 03/25/23 progressing  04/03/23 progressing  3.  Frequency and intensity of radicular sx to have improved by 75% Baseline:  Goal status:progressing 03/21/23 and 04/04/23  04/08/23 70% better  4.  Will be able to sleep through the night without waking due to pain   Baseline:  Goal status: 03/11/23 progressing  03/25/23 progressing and 04/04/23  04/08/23 MET  5.  Will be able to perform all functional work based tasks at home and in the workplace without increase in pain  Baseline:  Goal status: 03/11/23 progressing  and 03/25/23 and 04/04/23     PLAN:  PT FREQUENCY: 2x/week  PT DURATION: 6 weeks  PLANNED INTERVENTIONS: Therapeutic exercises, Therapeutic activity, Neuromuscular re-education, Balance training, Gait training, Patient/Family education, Self Care, Joint mobilization, Aquatic Therapy, Dry Needling, Electrical stimulation, Spinal mobilization, Cryotherapy, Moist heat, Taping, Traction, Ultrasound, Ionotophoresis 4mg /ml Dexamethasone, Manual therapy, and Re-evaluation  PLAN FOR NEXT SESSION: progress Tomoki Lucken,ANGIE, PTA 04/08/2023, 8:32 AM   Phone: [phone]   Fax:  [phone] [address] [place], [place], [address] Phone: [phone]   Fax:  [phone]  Patient Details  Name: Hansford Jemison MRN: [id number] Date of Birth: May 18, 1966 Referring Provider:  Galvin Proffer, MD  Encounter Date: 04/08/2023    Suanne Marker, PTA 04/08/2023, 8:32 AM  [facility] [address] [place], [place], [address] Phone: [phone]   Fax:  [phone] [address] [place], [place], [address] Phone: [phone]   Fax:  [phone]  Patient Details  Name: Jadiel Farb MRN: [id number] Date of Birth: Apr 27, 1966 Referring Provider:  Galvin Proffer, MD  Encounter Date: 04/08/2023

## 2023-04-10 ENCOUNTER — Ambulatory Visit: Payer: Commercial Managed Care - HMO | Admitting: Physical Therapy

## 2023-04-10 DIAGNOSIS — M542 Cervicalgia: Secondary | ICD-10-CM | POA: Diagnosis not present

## 2023-04-10 DIAGNOSIS — R293 Abnormal posture: Secondary | ICD-10-CM

## 2023-04-10 DIAGNOSIS — R29898 Other symptoms and signs involving the musculoskeletal system: Secondary | ICD-10-CM

## 2023-04-10 NOTE — Therapy (Signed)
OUTPATIENT PHYSICAL THERAPY CERVICAL TREATMENT   Patient Name: Darin White MRN: [id number] DOB:06-May-1966, 57 y.o., male Today's Date: 04/10/2023  END OF SESSION:  PT End of Session - 04/10/23 0802     Visit Number 14    Date for PT Re-Evaluation 04/22/23    PT Start Time 0800    PT Stop Time 0845    PT Time Calculation (min) 45 min              Past Medical History:  Diagnosis Date   Anxiety    Bee sting-induced anaphylaxis    CAD (coronary artery disease)    Rocky [place] spotted fever    Traumatic brain injury (HCC)    Vasculitis (HCC)    Past Surgical History:  Procedure Laterality Date   arm surgery     left arm / tendon    STENT REMOVAL     Patient Active Problem List   Diagnosis Date Noted   RUQ pain 07/27/2020   Antiplatelet or antithrombotic long-term use 07/27/2020   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 01/12/2013   LOC (loss of consciousness) (HCC) 01/12/2013   Hyperglycemia 01/12/2013   Fall 01/12/2013   Abrasion of face 01/12/2013   HYPERLIPIDEMIA 09/12/2008   SHOULDER PAIN, RIGHT 09/12/2008   PLANTAR FASCIITIS, LEFT 09/12/2008   OTHER ANXIETY STATES 08/25/2008   TOBACCO ABUSE 08/25/2008   PALPITATIONS 08/25/2008   DYSPNEA ON EXERTION 08/25/2008   NEPHROLITHIASIS, HX OF 08/25/2008    PCP: Angelina Pih MD   REFERRING PROVIDER: Beverely Low, MD  REFERRING DIAG: M54.2 (ICD-10-CM) - Cervicalgia  THERAPY DIAG:  Cervicalgia  Other symptoms and signs involving the musculoskeletal system  Abnormal posture  Rationale for Evaluation and Treatment: Rehabilitation  ONSET DATE: 2.5 months ago  SUBJECTIVE:                                                                                                                                                                                                         SUBJECTIVE STATEMENT: sore after last session, helped RT side but left side N/T back PERTINENT HISTORY:  Anxiety, CAD, [place] mountain  spotted fever, TBI, vasculitis, arm surgery  PAIN:  Are you having pain? Yes: NPRS scale: 5/10 Pain location: L UE down arm  Pain description: tingling Aggravating factors: sitting up straight  Relieving factors: cocking head to right   PRECAUTIONS: Other: active tear R bicep, careful with loading too much, watch BP    RED FLAGS: None     WEIGHT BEARING RESTRICTIONS: No  FALLS:  Has patient fallen  in last 6 months? 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  PLOF: Independent, Independent with basic ADLs, Independent with gait, and Independent with transfers  PATIENT GOALS: be able keep going with least amount of pain as possible   NEXT MD VISIT: Referring PRN  OBJECTIVE:   PATIENT SURVEYS:  FOTO 2nd session   COGNITION: Overall cognitive status: Within functional limits for tasks assessed  SENSATION: Reports some tingling/numbness L UE with certain head positions  POSTURE: rounded shoulders, forward head, and increased thoracic kyphosis  PALPATION: TTP around paraspinals high thoracic region   CERVICAL ROM:   Active ROM A/PROM (deg) eval  Flexion 20* p!  Extension 12* p!  Right lateral flexion 26*  Left lateral flexion 21* p!  Right rotation 75% limited p!  Left rotation 25% limited   (Blank rows = not tested)  UPPER EXTREMITY ROM:  Active ROM Right eval Left eval  Shoulder flexion [facility]  Shoulder extension    Shoulder abduction [facility]  Shoulder adduction    Shoulder extension    Shoulder internal rotation T12 T10  Shoulder external rotation C7 C7  Elbow flexion    Elbow extension    Wrist flexion    Wrist extension    Wrist ulnar deviation    Wrist radial deviation    Wrist pronation    Wrist supination     (Blank rows = not tested)    TODAY'S TREATMENT:                                                                                                                               DATE:   04/10/23 US/estim combo left UT/cerv/rhom 10 min STM to the left upper trap and rhomboid PROM of the cervical spine to end range, end range contract relax Mulligan cerv SNAGS and NAGS to increase cerv rotation and lateral flexion MHP/IFC to the left upper trap   04/08/23  DSTW in prone to thoracic and cerv TP and tightness RT thoracic and left UT Supine thoracic stretching with overpressure Cerv manual traction and stretching IFC and MH in supine to above     04/04/23 US/estim combo left UT/cerv/rhom 10 min STM to the left upper trap and rhomboid PROM of the cervical spine to end range, end range contract relax Mulligan cerv SNAGS and NAGS to increase cerv rotation and lateral flexion MHP/IFC to the left upper trap  03/27/23 US/estim combo left UT/cerv/rhom 8 min STM to the left upper trap with and without theragun PROM of the cervical spine to end range, end range contract relax LEFT UE neural tension strtech and shld ROM, positive NT and limited ER MHP/IFC to the left upper trap   03/25/23 US/estim combo left UT/cerv/rhom 8 min STM to the left upper trap with and without theragun PROM of the cervical spine to end range, end  range contract relax MHP/IFC to the left upper trap 03/21/23 STM to the left upper trap Cervical mobs, 1st rib mobs PROM of the cervical spine to end range, end range contract relax MHP/IFC to the left upper trap  03/18/23 US/estim combo left UT/cerv/rhom 8 min DSTW to above with TP release MH/IFC to above sitting Cerv ROM [facility]  03/13/23 US/estim combo left UT/cerv/rhom 8 min DSTW to above with TP release MH/IFC to above sitting  03/11/23 US/estim combo left UT/cerv/rhom 8 min DSTW to above with TP release MH/IFC to above sitting  03/06/23 US/estim combo left UT/cerv/rhom 8 min DSTW to above with TP release MH/IFC to above sitting  03/04/23 UBE 2 min fwd 2 min backward L  3 Cable pulley shld ext 10# 2 sets 10. 15# row 2 sets 10 Green tband ER 2 sets 10 Cerv retraction head on ball on wall 15x hold 3 sec Ball vs wall 5 x CW and CCW Lat pull down 20# 2 sets 10 Neural tension stretching LUE Mulligan cerv SNAGS and NAGS to increase cerv ROM DSTW with TP release left UT/rhomoid DN to above by MAlbright PT with twitch response  02/25/23 Attempted BP check due to recent hx of severe HTN, repeated error messages on machine, monitored sx closely and adjusted interventions PRN   TherEx  UBE L5 x6 minutes backwards, dropped to L4 due to increased RUE pain  3 way reaches with green TB on wall x10 B Chin tuck + rotation x10 B cues to not force movement through pain Chin tuck + lateral flexion x10 B cues to not force movement through pain  02/21/23 Neural tension stretching LUE Mulligan cerv SNAGS and NAGS to increase cerv ROM DSTW with TP release left UT/rhomoid DN to left Cerv/UT by Amy Speaks PT  - twitch response  Mech cerv traction supine 15 min Reviewed HEP and stressed importance of compliance  Eval  Objective measures, POC, HEP   Discussed/demonstrated and practiced all exercises in HEP  PATIENT EDUCATION:  Education details: exam findings, POC, HEP  Person educated: Patient Education method: Explanation, Demonstration, and Handouts Education comprehension: verbalized understanding, returned demonstration, and needs further education  HOME EXERCISE PROGRAM: Access Code: [email or web address] URL: [email or web address] Date: 02/11/2023 Prepared by: Nedra Hai  Exercises - Seated Cervical Retraction  - 2 x daily - 7 x weekly - 1 sets - 10 reps - 3 seconds hold - Seated Thoracic Lumbar Extension with Pectoralis Stretch  - 2 x daily - 7 x weekly - 1 sets - 10 reps - 3 seconds  hold - Seated Scapular Retraction with External Rotation  - 2 x daily - 7 x weekly - 1 sets - 10 reps - 2 seconds  hold - Seated Thoracic Flexion and Rotation with Arms  Crossed  - 2 x daily - 7 x weekly - 1 sets - 10 reps - 2 seconds  hold  ASSESSMENT:  CLINICAL IMPRESSION:  pt continues to have large TP that respond to STW but are not fully being alleviated. Pt does not like DN. OBJECTIVE IMPAIRMENTS: decreased ROM, hypomobility, increased fascial restrictions, increased muscle spasms, impaired flexibility, impaired sensation, impaired UE functional use, improper body mechanics, and postural dysfunction.   ACTIVITY LIMITATIONS: carrying, lifting, reach over head, and caring for others  PARTICIPATION LIMITATIONS: driving, shopping, community activity, occupation, and yard work  PERSONAL FACTORS: Age, Behavior pattern, Education, Fitness, Past/current experiences, Profession, and Time since onset of injury/illness/exacerbation are also affecting patient's functional outcome.   REHAB POTENTIAL: Fair  complex medical history, extensive hx of ortho injuries, chronicity of pain, physical job   CLINICAL DECISION MAKING: Stable/uncomplicated  EVALUATION COMPLEXITY: Low   GOALS: Goals reviewed with patient? No  SHORT TERM GOALS: Target date: 03/04/2023    Will be compliant with appropriate progressive HEP  Baseline:  Goal status: 03/04/23 met  2.  Cervical ROM to be WNL without increased pain  Baseline:  Goal status: 03/06/23 progressing  03/18/23 MET  3.  Frequency and intensity of radicular sx in L UE to have improved by 50% Baseline:  Goal status: 03/04/23 progressing  03/18/23 comes and goes  03/25/23 MET    LONG TERM GOALS: Target date: 03/25/2023    MMT to be 5/5 globally with no increased pain or radicular sx  Baseline:  Goal status: Iprogressing 03/21/23  Progressing 03/25/23 ER 4+/5 No changes 04/04/23  2.  Will be able to maintain upright midline posture with no increased pain  Baseline:  Goal status: 03/25/23 progressing  04/03/23 progressing  3.  Frequency and intensity of radicular sx to have improved by 75% Baseline:  Goal  status:progressing 03/21/23 and 04/04/23  04/08/23 70% better  4.  Will be able to sleep through the night without waking due to pain  Baseline:  Goal status: 03/11/23 progressing  03/25/23 progressing and 04/04/23  04/08/23 MET  5.  Will be able to perform all functional work based tasks at home and in the workplace without increase in pain  Baseline:  Goal status: 03/11/23 progressing  and 03/25/23 and 04/04/23     PLAN:  PT FREQUENCY: 2x/week  PT DURATION: 6 weeks  PLANNED INTERVENTIONS: Therapeutic exercises, Therapeutic activity, Neuromuscular re-education, Balance training, Gait training, Patient/Family education, Self Care, Joint mobilization, Aquatic Therapy, Dry Needling, Electrical stimulation, Spinal mobilization, Cryotherapy, Moist heat, Taping, Traction, Ultrasound, Ionotophoresis 4mg /ml Dexamethasone, Manual therapy, and Re-evaluation  PLAN FOR NEXT SESSION: start cerv and scap stab ex Fox Salminen,ANGIE, PTA 04/10/2023, 8:03 AM   Phone: [phone]   Fax:  [phone] [address] [place], [place], [address] Phone: [phone]   Fax:  [phone]  Patient Details  Name: Darin White MRN: [id number] Date of Birth: 17-Apr-1966 Referring Provider:  Galvin Proffer, MD  Encounter Date: 04/10/2023    Suanne Marker, PTA 04/10/2023, 8:03 AM  [facility] [place] [facility] [address] [place], [place], [address] Phone: [phone]   Fax:  [phone] [address] [place], [place], [address] Phone: [phone]   Fax:  [phone]  Patient Details  Name: Darin White MRN: [id number] Date of Birth: June 21, 1965 Referring Provider:  Galvin Proffer, MD  Encounter Date: 04/10/2023

## 2023-04-15 ENCOUNTER — Ambulatory Visit: Payer: Commercial Managed Care - HMO | Admitting: Physical Therapy

## 2023-04-15 DIAGNOSIS — M542 Cervicalgia: Secondary | ICD-10-CM | POA: Diagnosis not present

## 2023-04-15 DIAGNOSIS — R293 Abnormal posture: Secondary | ICD-10-CM

## 2023-04-15 DIAGNOSIS — R29898 Other symptoms and signs involving the musculoskeletal system: Secondary | ICD-10-CM

## 2023-04-15 NOTE — Therapy (Signed)
OUTPATIENT PHYSICAL THERAPY CERVICAL TREATMENT   Patient Name: Darin White MRN: [id number] DOB:09/01/1965, 57 y.o., male Today's Date: 04/15/2023  END OF SESSION:  PT End of Session - 04/15/23 0808     Visit Number 15    Date for PT Re-Evaluation 04/22/23    PT Start Time 0805    PT Stop Time 0850    PT Time Calculation (min) 45 min              Past Medical History:  Diagnosis Date   Anxiety    Bee sting-induced anaphylaxis    CAD (coronary artery disease)    Rocky [place] spotted fever    Traumatic brain injury (HCC)    Vasculitis (HCC)    Past Surgical History:  Procedure Laterality Date   arm surgery     left arm / tendon    STENT REMOVAL     Patient Active Problem List   Diagnosis Date Noted   RUQ pain 07/27/2020   Antiplatelet or antithrombotic long-term use 07/27/2020   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 01/12/2013   LOC (loss of consciousness) (HCC) 01/12/2013   Hyperglycemia 01/12/2013   Fall 01/12/2013   Abrasion of face 01/12/2013   HYPERLIPIDEMIA 09/12/2008   SHOULDER PAIN, RIGHT 09/12/2008   PLANTAR FASCIITIS, LEFT 09/12/2008   OTHER ANXIETY STATES 08/25/2008   TOBACCO ABUSE 08/25/2008   PALPITATIONS 08/25/2008   DYSPNEA ON EXERTION 08/25/2008   NEPHROLITHIASIS, HX OF 08/25/2008    PCP: Angelina Pih MD   REFERRING PROVIDER: Beverely Low, MD  REFERRING DIAG: M54.2 (ICD-10-CM) - Cervicalgia  THERAPY DIAG:  Cervicalgia  Other symptoms and signs involving the musculoskeletal system  Abnormal posture  Rationale for Evaluation and Treatment: Rehabilitation  ONSET DATE: 2.5 months ago  SUBJECTIVE:                                                                                                                                                                                                         SUBJECTIVE STATEMENT: fishing this weekend and throwing net for bait aggravated me RT >left side PERTINENT HISTORY:  Anxiety, CAD,  rocky mountain spotted fever, TBI, vasculitis, arm surgery  PAIN:  Are you having pain? Yes: NPRS scale: 5/10 Pain location: L UE down arm  Pain description: tingling Aggravating factors: sitting up straight  Relieving factors: cocking head to right   PRECAUTIONS: Other: active tear R bicep, careful with loading too much, watch BP    RED FLAGS: None     WEIGHT BEARING RESTRICTIONS: No  FALLS:  Has patient  fallen in last 6 months? 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  PLOF: Independent, Independent with basic ADLs, Independent with gait, and Independent with transfers  PATIENT GOALS: be able keep going with least amount of pain as possible   NEXT MD VISIT: Referring PRN  OBJECTIVE:   PATIENT SURVEYS:  FOTO 2nd session   COGNITION: Overall cognitive status: Within functional limits for tasks assessed  SENSATION: Reports some tingling/numbness L UE with certain head positions  POSTURE: rounded shoulders, forward head, and increased thoracic kyphosis  PALPATION: TTP around paraspinals high thoracic region   CERVICAL ROM:   Active ROM A/PROM (deg) eval  Flexion 20* p!  Extension 12* p!  Right lateral flexion 26*  Left lateral flexion 21* p!  Right rotation 75% limited p!  Left rotation 25% limited   (Blank rows = not tested)  UPPER EXTREMITY ROM:  Active ROM Right eval Left eval  Shoulder flexion [facility]  Shoulder extension    Shoulder abduction [facility]  Shoulder adduction    Shoulder extension    Shoulder internal rotation T12 T10  Shoulder external rotation C7 C7  Elbow flexion    Elbow extension    Wrist flexion    Wrist extension    Wrist ulnar deviation    Wrist radial deviation    Wrist pronation    Wrist supination     (Blank rows = not tested)    TODAY'S TREATMENT:                                                                                                                               DATE:   04/15/23 UBE L 4 2 min each way Head on ball on wall in cerv retraction with 3# UE 12 x, shld flex,chest press,abd,ER,PNF ( abd rough on RT shld, PNF aggravated RT rib area cramp) Green tband shld ext and row -postural cuing  15 x Upright row 6# 12x STM to BIL cerv,upper trap and rhomboid PROM of the cervical spine to end range, end range contract relax MHP/IFC to the left upper trap      04/10/23 US/estim combo left UT/cerv/rhom 10 min STM to the left upper trap and rhomboid PROM of the cervical spine to end range, end range contract relax Mulligan cerv SNAGS and NAGS to increase cerv rotation and lateral flexion MHP/IFC to the left upper trap   04/08/23  DSTW in prone to thoracic and cerv TP and tightness RT thoracic and left UT Supine thoracic stretching with overpressure Cerv manual traction and stretching IFC and MH in supine to above     04/04/23 US/estim combo left UT/cerv/rhom 10 min STM to the left upper trap and rhomboid PROM of the cervical spine to end range, end range contract relax Mulligan cerv SNAGS and NAGS to increase cerv rotation and lateral flexion MHP/IFC  to the left upper trap  03/27/23 US/estim combo left UT/cerv/rhom 8 min STM to the left upper trap with and without theragun PROM of the cervical spine to end range, end range contract relax LEFT UE neural tension strtech and shld ROM, positive NT and limited ER MHP/IFC to the left upper trap   03/25/23 US/estim combo left UT/cerv/rhom 8 min STM to the left upper trap with and without theragun PROM of the cervical spine to end range, end range contract relax MHP/IFC to the left upper trap 03/21/23 STM to the left upper trap Cervical mobs, 1st rib mobs PROM of the cervical spine to end range, end range contract relax MHP/IFC to the left upper trap  03/18/23 US/estim combo left UT/cerv/rhom 8 min DSTW to above with  TP release MH/IFC to above sitting Cerv ROM [facility]  03/13/23 US/estim combo left UT/cerv/rhom 8 min DSTW to above with TP release MH/IFC to above sitting  03/11/23 US/estim combo left UT/cerv/rhom 8 min DSTW to above with TP release MH/IFC to above sitting  03/06/23 US/estim combo left UT/cerv/rhom 8 min DSTW to above with TP release MH/IFC to above sitting  03/04/23 UBE 2 min fwd 2 min backward L 3 Cable pulley shld ext 10# 2 sets 10. 15# row 2 sets 10 Green tband ER 2 sets 10 Cerv retraction head on ball on wall 15x hold 3 sec Ball vs wall 5 x CW and CCW Lat pull down 20# 2 sets 10 Neural tension stretching LUE Mulligan cerv SNAGS and NAGS to increase cerv ROM DSTW with TP release left UT/rhomoid DN to above by MAlbright PT with twitch response  02/25/23 Attempted BP check due to recent hx of severe HTN, repeated error messages on machine, monitored sx closely and adjusted interventions PRN   TherEx  UBE L5 x6 minutes backwards, dropped to L4 due to increased RUE pain  3 way reaches with green TB on wall x10 B Chin tuck + rotation x10 B cues to not force movement through pain Chin tuck + lateral flexion x10 B cues to not force movement through pain  02/21/23 Neural tension stretching LUE Mulligan cerv SNAGS and NAGS to increase cerv ROM DSTW with TP release left UT/rhomoid DN to left Cerv/UT by Amy Speaks PT  - twitch response  Mech cerv traction supine 15 min Reviewed HEP and stressed importance of compliance  Eval  Objective measures, POC, HEP   Discussed/demonstrated and practiced all exercises in HEP  PATIENT EDUCATION:  Education details: exam findings, POC, HEP  Person educated: Patient Education method: Explanation, Demonstration, and Handouts Education comprehension: verbalized understanding, returned demonstration, and needs further education  HOME EXERCISE PROGRAM: Access Code: [email or web address] URL: [email or web address] Date:  02/11/2023 Prepared by: Nedra Hai  Exercises - Seated Cervical Retraction  - 2 x daily - 7 x weekly - 1 sets - 10 reps - 3 seconds hold - Seated Thoracic Lumbar Extension with Pectoralis Stretch  - 2 x daily - 7 x weekly - 1 sets - 10 reps - 3 seconds  hold - Seated Scapular Retraction with External Rotation  - 2 x daily - 7 x weekly - 1 sets - 10 reps - 2 seconds  hold - Seated Thoracic Flexion and Rotation with Arms Crossed  - 2 x daily - 7 x weekly - 1 sets - 10 reps - 2 seconds  hold  ASSESSMENT:  CLINICAL IMPRESSION:  started stab ex with postural cuing needed. pt continues to have large TP left rhomboid/trap and  tightness throughout that respond to STW but are not fully being alleviated. Pt does not like DN. OBJECTIVE IMPAIRMENTS: decreased ROM, hypomobility, increased fascial restrictions, increased muscle spasms, impaired flexibility, impaired sensation, impaired UE functional use, improper body mechanics, and postural dysfunction.   ACTIVITY LIMITATIONS: carrying, lifting, reach over head, and caring for others  PARTICIPATION LIMITATIONS: driving, shopping, community activity, occupation, and yard work  PERSONAL FACTORS: Age, Behavior pattern, Education, Fitness, Past/current experiences, Profession, and Time since onset of injury/illness/exacerbation are also affecting patient's functional outcome.   REHAB POTENTIAL: Fair complex medical history, extensive hx of ortho injuries, chronicity of pain, physical job   CLINICAL DECISION MAKING: Stable/uncomplicated  EVALUATION COMPLEXITY: Low   GOALS: Goals reviewed with patient? No  SHORT TERM GOALS: Target date: 03/04/2023    Will be compliant with appropriate progressive HEP  Baseline:  Goal status: 03/04/23 met  2.  Cervical ROM to be WNL without increased pain  Baseline:  Goal status: 03/06/23 progressing  03/18/23 MET  3.  Frequency and intensity of radicular sx in L UE to have improved by 50% Baseline:  Goal  status: 03/04/23 progressing  03/18/23 comes and goes  03/25/23 MET    LONG TERM GOALS: Target date: 03/25/2023    MMT to be 5/5 globally with no increased pain or radicular sx  Baseline:  Goal status: Iprogressing 03/21/23  Progressing 03/25/23 ER 4+/5 No changes 04/04/23  2.  Will be able to maintain upright midline posture with no increased pain  Baseline:  Goal status: 03/25/23 progressing  04/03/23 progressing  3.  Frequency and intensity of radicular sx to have improved by 75% Baseline:  Goal status:progressing 03/21/23 and 04/04/23  04/08/23 70% better  4.  Will be able to sleep through the night without waking due to pain  Baseline:  Goal status: 03/11/23 progressing  03/25/23 progressing and 04/04/23  04/08/23 MET  5.  Will be able to perform all functional work based tasks at home and in the workplace without increase in pain  Baseline:  Goal status: 03/11/23 progressing  and 03/25/23 and 04/04/23     PLAN:  PT FREQUENCY: 2x/week  PT DURATION: 6 weeks  PLANNED INTERVENTIONS: Therapeutic exercises, Therapeutic activity, Neuromuscular re-education, Balance training, Gait training, Patient/Family education, Self Care, Joint mobilization, Aquatic Therapy, Dry Needling, Electrical stimulation, Spinal mobilization, Cryotherapy, Moist heat, Taping, Traction, Ultrasound, Ionotophoresis 4mg /ml Dexamethasone, Manual therapy, and Re-evaluation  PLAN FOR NEXT SESSION: assess and progress Lavada Langsam,ANGIE, PTA 04/15/2023, 8:40 AM   Phone: [phone]   Fax:  [phone] [address] [place], [place], [address] Phone: [phone]   Fax:  [phone]  Patient Details  Name: Holsey Herlocker MRN: [id number] Date of Birth: [birth year] Referring Provider:  Galvin Proffer, MD  Encounter Date: 04/15/2023    Suanne Marker, PTA 04/15/2023, 8:40 AM  [facility] [address] [place], [place], [address] Phone: [phone]   Fax:  [phone] [address] [place], [place], [address] Phone: [phone]   Fax:  [phone]  Patient Details  Name: Richards Snipe MRN: [id number] Date of Birth: 1966/06/11 Referring Provider:  Galvin Proffer, MD  Encounter Date: 04/15/2023  [facility] at [facility] [address] [place], [place], [address] Phone: [phone]   Fax:  [phone]  Patient Details  Name: Osburn Babiak MRN: [id number] Date of Birth: [birth year] Referring  Provider:  Galvin Proffer, MD  Encounter Date: 04/15/2023   Suanne Marker, PTA 04/15/2023, 8:40 AM  [facility] [address] [place], [place], [address] Phone: [phone]   Fax:  [phone]

## 2023-04-18 ENCOUNTER — Encounter: Payer: Self-pay | Admitting: Physical Therapy

## 2023-04-18 ENCOUNTER — Ambulatory Visit: Payer: Commercial Managed Care - HMO | Attending: Internal Medicine | Admitting: Physical Therapy

## 2023-04-18 DIAGNOSIS — R293 Abnormal posture: Secondary | ICD-10-CM | POA: Diagnosis present

## 2023-04-18 DIAGNOSIS — R29898 Other symptoms and signs involving the musculoskeletal system: Secondary | ICD-10-CM | POA: Diagnosis present

## 2023-04-18 DIAGNOSIS — M542 Cervicalgia: Secondary | ICD-10-CM | POA: Insufficient documentation

## 2023-04-18 NOTE — Therapy (Signed)
OUTPATIENT PHYSICAL THERAPY CERVICAL TREATMENT   Patient Name: Darin White MRN: [id number] DOB:February 08, 1966, 57 y.o., male Today's Date: 04/18/2023  END OF SESSION:  PT End of Session - 04/18/23 0803     Visit Number 16    Date for PT Re-Evaluation 04/22/23    PT Start Time 0804    PT Stop Time 0845    PT Time Calculation (min) 41 min    Activity Tolerance Patient tolerated treatment well    Behavior During Therapy [facility] for tasks assessed/performed              Past Medical History:  Diagnosis Date   Anxiety    Bee sting-induced anaphylaxis    CAD (coronary artery disease)    Rocky Mountain spotted fever    Traumatic brain injury (HCC)    Vasculitis (HCC)    Past Surgical History:  Procedure Laterality Date   arm surgery     left arm / tendon    STENT REMOVAL     Patient Active Problem List   Diagnosis Date Noted   RUQ pain 07/27/2020   Antiplatelet or antithrombotic long-term use 07/27/2020   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 01/12/2013   LOC (loss of consciousness) (HCC) 01/12/2013   Hyperglycemia 01/12/2013   Fall 01/12/2013   Abrasion of face 01/12/2013   HYPERLIPIDEMIA 09/12/2008   SHOULDER PAIN, RIGHT 09/12/2008   PLANTAR FASCIITIS, LEFT 09/12/2008   OTHER ANXIETY STATES 08/25/2008   TOBACCO ABUSE 08/25/2008   PALPITATIONS 08/25/2008   DYSPNEA ON EXERTION 08/25/2008   NEPHROLITHIASIS, HX OF 08/25/2008    PCP: Angelina Pih MD   REFERRING PROVIDER: Beverely Low, MD  REFERRING DIAG: M54.2 (ICD-10-CM) - Cervicalgia  THERAPY DIAG:  Cervicalgia  Abnormal posture  Other symptoms and signs involving the musculoskeletal system  Rationale for Evaluation and Treatment: Rehabilitation  ONSET DATE: 2.5 months ago  SUBJECTIVE:                                                                                                                                                                                                         SUBJECTIVE  STATEMENT: Tingling down his L harm yesterday.  PERTINENT HISTORY:  Anxiety, CAD, rocky mountain spotted fever, TBI, vasculitis, arm surgery  PAIN:  Are you having pain? Yes: NPRS scale: 0/10 Pain location: L UE down arm  Pain description: tingling Aggravating factors: sitting up straight  Relieving factors: cocking head to right   PRECAUTIONS: Other: active tear R bicep, careful with loading too much, watch BP    RED FLAGS: None  WEIGHT BEARING RESTRICTIONS: No  FALLS:  Has patient fallen in last 6 months? 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  PLOF: Independent, Independent with basic ADLs, Independent with gait, and Independent with transfers  PATIENT GOALS: be able keep going with least amount of pain as possible   NEXT MD VISIT: Referring PRN  OBJECTIVE:   PATIENT SURVEYS:  FOTO 2nd session   COGNITION: Overall cognitive status: Within functional limits for tasks assessed  SENSATION: Reports some tingling/numbness L UE with certain head positions  POSTURE: rounded shoulders, forward head, and increased thoracic kyphosis  PALPATION: TTP around paraspinals high thoracic region   CERVICAL ROM:   Active ROM A/PROM (deg) eval  Flexion 20* p!  Extension 12* p!  Right lateral flexion 26*  Left lateral flexion 21* p!  Right rotation 75% limited p!  Left rotation 25% limited   (Blank rows = not tested)  UPPER EXTREMITY ROM:  Active ROM Right eval Left eval  Shoulder flexion [facility]  Shoulder extension    Shoulder abduction [facility]  Shoulder adduction    Shoulder extension    Shoulder internal rotation T12 T10  Shoulder external rotation C7 C7  Elbow flexion    Elbow extension    Wrist flexion    Wrist extension    Wrist ulnar deviation    Wrist radial deviation    Wrist pronation    Wrist supination     (Blank rows = not  tested)    TODAY'S TREATMENT:                                                                                                                              DATE:  04/18/23 UBE L 4 2 min each way Rows & Lats 35lb 2x10 Cervical retraction 2x10 Horizontal abd green 2x10 STM to BIL cerv,upper trap and rhomboid PROM of the cervical spine to end range, end range contract relax MHP/IFC to the left upper trap  04/15/23 UBE L 4 2 min each way Head on ball on wall in cerv retraction with 3# UE 12 x, shld flex,chest press,abd,ER,PNF ( abd rough on RT shld, PNF aggravated RT rib area cramp) Green tband shld ext and row -postural cuing  15 x Upright row 6# 12x STM to BIL cerv,upper trap and rhomboid PROM of the cervical spine to end range, end range contract relax MHP/IFC to the left upper trap    04/10/23 US/estim combo left UT/cerv/rhom 10 min STM to the left upper trap and rhomboid PROM of the cervical spine to end range, end range contract relax Mulligan cerv SNAGS and NAGS to increase cerv rotation and lateral flexion MHP/IFC to the left upper trap   04/08/23  DSTW in prone to thoracic and cerv TP and tightness RT thoracic and left UT Supine thoracic stretching with overpressure Cerv manual traction and  stretching IFC and MH in supine to above     04/04/23 US/estim combo left UT/cerv/rhom 10 min STM to the left upper trap and rhomboid PROM of the cervical spine to end range, end range contract relax Mulligan cerv SNAGS and NAGS to increase cerv rotation and lateral flexion MHP/IFC to the left upper trap  03/27/23 US/estim combo left UT/cerv/rhom 8 min STM to the left upper trap with and without theragun PROM of the cervical spine to end range, end range contract relax LEFT UE neural tension strtech and shld ROM, positive NT and limited ER MHP/IFC to the left upper trap   03/25/23 US/estim combo left UT/cerv/rhom 8 min STM to the left upper trap with and without  theragun PROM of the cervical spine to end range, end range contract relax MHP/IFC to the left upper trap 03/21/23 STM to the left upper trap Cervical mobs, 1st rib mobs PROM of the cervical spine to end range, end range contract relax MHP/IFC to the left upper trap  03/18/23 US/estim combo left UT/cerv/rhom 8 min DSTW to above with TP release MH/IFC to above sitting Cerv ROM [id number] [facility]  03/13/23 US/estim combo left UT/cerv/rhom 8 min DSTW to above with TP release MH/IFC to above sitting  03/11/23 US/estim combo left UT/cerv/rhom 8 min DSTW to above with TP release MH/IFC to above sitting  03/06/23 US/estim combo left UT/cerv/rhom 8 min DSTW to above with TP release MH/IFC to above sitting  03/04/23 UBE 2 min fwd 2 min backward L 3 Cable pulley shld ext 10# 2 sets 10. 15# row 2 sets 10 Green tband ER 2 sets 10 Cerv retraction head on ball on wall 15x hold 3 sec Ball vs wall 5 x CW and CCW Lat pull down 20# 2 sets 10 Neural tension stretching LUE Mulligan cerv SNAGS and NAGS to increase cerv ROM DSTW with TP release left UT/rhomoid DN to above by MAlbright PT with twitch response  02/25/23 Attempted BP check due to recent hx of severe HTN, repeated error messages on machine, monitored sx closely and adjusted interventions PRN   TherEx  UBE L5 x6 minutes backwards, dropped to L4 due to increased RUE pain  3 way reaches with green TB on wall x10 B Chin tuck + rotation x10 B cues to not force movement through pain Chin tuck + lateral flexion x10 B cues to not force movement through pain  02/21/23 Neural tension stretching LUE Mulligan cerv SNAGS and NAGS to increase cerv ROM DSTW with TP release left UT/rhomoid DN to left Cerv/UT by Amy Speaks PT  - twitch response  Mech cerv traction supine 15 min Reviewed HEP and stressed importance of compliance  Eval  Objective measures, POC, HEP   Discussed/demonstrated and practiced all exercises in HEP  PATIENT EDUCATION:   Education details: exam findings, POC, HEP  Person educated: Patient Education method: Explanation, Demonstration, and Handouts Education comprehension: verbalized understanding, returned demonstration, and needs further education  HOME EXERCISE PROGRAM: Access Code: [email or web address] URL: [email or web address] Date: 02/11/2023 Prepared by: Nedra Hai  Exercises - Seated Cervical Retraction  - 2 x daily - 7 x weekly - 1 sets - 10 reps - 3 seconds hold - Seated Thoracic Lumbar Extension with Pectoralis Stretch  - 2 x daily - 7 x weekly - 1 sets - 10 reps - 3 seconds  hold - Seated Scapular Retraction with External Rotation  - 2 x daily - 7 x weekly - 1 sets - 10 reps - 2 seconds  hold - Seated Thoracic Flexion and Rotation with Arms Crossed  - 2 x daily - 7 x weekly - 1 sets - 10 reps - 2 seconds  hold  ASSESSMENT:  CLINICAL IMPRESSION:  continued with  stab ex with postural cuing needed. pt continues to have large TP left rhomboid/trap and tightness throughout that respond to STW but are not fully being alleviated. Advised to call MD for MRI  OBJECTIVE IMPAIRMENTS: decreased ROM, hypomobility, increased fascial restrictions, increased muscle spasms, impaired flexibility, impaired sensation, impaired UE functional use, improper body mechanics, and postural dysfunction.   ACTIVITY LIMITATIONS: carrying, lifting, reach over head, and caring for others  PARTICIPATION LIMITATIONS: driving, shopping, community activity, occupation, and yard work  PERSONAL FACTORS: Age, Behavior pattern, Education, Fitness, Past/current experiences, Profession, and Time since onset of injury/illness/exacerbation are also affecting patient's functional outcome.   REHAB POTENTIAL: Fair complex medical history, extensive hx of ortho injuries, chronicity of pain, physical job   CLINICAL DECISION MAKING: Stable/uncomplicated  EVALUATION COMPLEXITY: Low   GOALS: Goals reviewed with patient?  No  SHORT TERM GOALS: Target date: 03/04/2023    Will be compliant with appropriate progressive HEP  Baseline:  Goal status: 03/04/23 met  2.  Cervical ROM to be WNL without increased pain  Baseline:  Goal status: 03/06/23 progressing  03/18/23 MET  3.  Frequency and intensity of radicular sx in L UE to have improved by 50% Baseline:  Goal status: 03/04/23 progressing  03/18/23 comes and goes  03/25/23 MET    LONG TERM GOALS: Target date: 03/25/2023    MMT to be 5/5 globally with no increased pain or radicular sx  Baseline:  Goal status: Iprogressing 03/21/23  Progressing 03/25/23 ER 4+/5 No changes 04/04/23  2.  Will be able to maintain upright midline posture with no increased pain  Baseline:  Goal status: 03/25/23 progressing  04/03/23 progressing  3.  Frequency and intensity of radicular sx to have improved by 75% Baseline:  Goal status:progressing 03/21/23 and 04/04/23  04/08/23 70% better  4.  Will be able to sleep through the night without waking due to pain  Baseline:  Goal status: 03/11/23 progressing  03/25/23 progressing and 04/04/23  04/08/23 MET  5.  Will be able to perform all functional work based tasks at home and in the workplace without increase in pain  Baseline:  Goal status: 03/11/23 progressing  and 03/25/23 and 04/04/23     PLAN:  PT FREQUENCY: 2x/week  PT DURATION: 6 weeks  PLANNED INTERVENTIONS: Therapeutic exercises, Therapeutic activity, Neuromuscular re-education, Balance training, Gait training, Patient/Family education, Self Care, Joint mobilization, Aquatic Therapy, Dry Needling, Electrical stimulation, Spinal mobilization, Cryotherapy, Moist heat, Taping, Traction, Ultrasound, Ionotophoresis 4mg /ml Dexamethasone, Manual therapy, and Re-evaluation  PLAN FOR NEXT SESSION: assess and progress Grayce Sessions, PTA 04/18/2023, 8:04 AM
